# Patient Record
Sex: Female | Born: 1980
Health system: Southern US, Community
[De-identification: ages and names within clinical notes are randomized; demographics above are authoritative.]

## PROBLEM LIST (undated history)

## (undated) DIAGNOSIS — E282 Polycystic ovarian syndrome: Secondary | ICD-10-CM

## (undated) DIAGNOSIS — F32A Depression, unspecified: Secondary | ICD-10-CM

## (undated) DIAGNOSIS — F329 Major depressive disorder, single episode, unspecified: Secondary | ICD-10-CM

## (undated) DIAGNOSIS — R87629 Unspecified abnormal cytological findings in specimens from vagina: Secondary | ICD-10-CM

## (undated) DIAGNOSIS — G4719 Other hypersomnia: Secondary | ICD-10-CM

## (undated) DIAGNOSIS — B009 Herpesviral infection, unspecified: Secondary | ICD-10-CM

## (undated) DIAGNOSIS — G43909 Migraine, unspecified, not intractable, without status migrainosus: Secondary | ICD-10-CM

## (undated) DIAGNOSIS — E669 Obesity, unspecified: Secondary | ICD-10-CM

## (undated) DIAGNOSIS — F429 Obsessive-compulsive disorder, unspecified: Secondary | ICD-10-CM

## (undated) DIAGNOSIS — K589 Irritable bowel syndrome without diarrhea: Secondary | ICD-10-CM

## (undated) DIAGNOSIS — O24419 Gestational diabetes mellitus in pregnancy, unspecified control: Secondary | ICD-10-CM

## (undated) DIAGNOSIS — F419 Anxiety disorder, unspecified: Secondary | ICD-10-CM

## (undated) HISTORY — DX: Other hypersomnia: G47.19

## (undated) HISTORY — DX: Gestational diabetes mellitus in pregnancy, unspecified control: O24.419

## (undated) HISTORY — DX: Herpesviral infection, unspecified: B00.9

## (undated) HISTORY — DX: Irritable bowel syndrome, unspecified: K58.9

## (undated) HISTORY — DX: Migraine, unspecified, not intractable, without status migrainosus: G43.909

## (undated) HISTORY — DX: Obsessive-compulsive disorder, unspecified: F42.9

## (undated) HISTORY — DX: Unspecified abnormal cytological findings in specimens from vagina: R87.629

## (undated) HISTORY — PX: OTHER SURGICAL HISTORY: SHX169

## (undated) HISTORY — PX: TONSILLECTOMY: SUR1361

## (undated) HISTORY — DX: Major depressive disorder, single episode, unspecified: F32.9

## (undated) HISTORY — DX: Depression, unspecified: F32.A

## (undated) HISTORY — DX: Anxiety disorder, unspecified: F41.9

---
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
## 2004-02-18 ENCOUNTER — Ambulatory Visit: Payer: Self-pay | Admitting: Family Medicine

## 2004-06-27 ENCOUNTER — Ambulatory Visit: Payer: Self-pay | Admitting: Family Medicine

## 2004-07-08 ENCOUNTER — Ambulatory Visit: Payer: Self-pay | Admitting: Family Medicine

## 2004-12-12 ENCOUNTER — Ambulatory Visit: Payer: Self-pay | Admitting: Family Medicine

## 2005-01-08 ENCOUNTER — Ambulatory Visit: Payer: Self-pay | Admitting: Family Medicine

## 2005-03-27 ENCOUNTER — Ambulatory Visit: Payer: Self-pay | Admitting: Family Medicine

## 2005-05-25 ENCOUNTER — Ambulatory Visit: Payer: Self-pay | Admitting: Family Medicine

## 2006-02-12 ENCOUNTER — Ambulatory Visit: Payer: Self-pay | Admitting: Physician Assistant

## 2006-08-02 ENCOUNTER — Ambulatory Visit: Payer: Self-pay | Admitting: Family Medicine

## 2008-05-09 ENCOUNTER — Other Ambulatory Visit: Admission: RE | Admit: 2008-05-09 | Discharge: 2008-05-09 | Payer: Self-pay | Admitting: Obstetrics and Gynecology

## 2008-05-28 ENCOUNTER — Other Ambulatory Visit: Admission: RE | Admit: 2008-05-28 | Discharge: 2008-05-28 | Payer: Self-pay | Admitting: Obstetrics and Gynecology

## 2011-07-22 DIAGNOSIS — E559 Vitamin D deficiency, unspecified: Secondary | ICD-10-CM | POA: Insufficient documentation

## 2011-07-22 DIAGNOSIS — L309 Dermatitis, unspecified: Secondary | ICD-10-CM | POA: Insufficient documentation

## 2011-07-22 DIAGNOSIS — G43909 Migraine, unspecified, not intractable, without status migrainosus: Secondary | ICD-10-CM | POA: Insufficient documentation

## 2011-07-22 DIAGNOSIS — R519 Headache, unspecified: Secondary | ICD-10-CM | POA: Insufficient documentation

## 2012-07-01 ENCOUNTER — Encounter: Payer: Self-pay | Admitting: Certified Nurse Midwife

## 2012-07-05 ENCOUNTER — Ambulatory Visit: Payer: Self-pay | Admitting: Certified Nurse Midwife

## 2012-07-11 ENCOUNTER — Telehealth: Payer: Self-pay | Admitting: Certified Nurse Midwife

## 2012-07-11 NOTE — Telephone Encounter (Signed)
Level of progesterone may be decreasing due to getting close to her next Depo.  She can try 600mg  of Motrin every 6 hours x 24 hours to see if this will decrease or stop bleeding.  Since she has done well with Depo this is probably the issue  If bleeding is heavy and continues needs OV

## 2012-07-11 NOTE — Telephone Encounter (Signed)
Pt is bleeding while on the depo shot. Please give pt a call.

## 2012-07-11 NOTE — Telephone Encounter (Signed)
LMTCB  aa 

## 2012-07-11 NOTE — Telephone Encounter (Signed)
Spoke with pt who has been on Depo since her last AEX almost a year ago. Pt has been bleeding for 15 days. Having cramps. Changing tampon 3-4 times a day. Bleeding is not really heavy, tampon not full each time. Pt due for next depo on 07-18-12. Please advise.   aa

## 2012-07-12 NOTE — Telephone Encounter (Signed)
Spoke with pt to advise about trying motrin 600 mg every 6 hrs x 24 to try to stop bleeding. Pt is allergic to red dye on outside of pills, but she will try to find a brand that does not have the color. Pt states she has an appt for her AEX next week and is worried she may still be bleeding. Advised as long as she is not bleeding heavily, she should be fine to get her Pap. Advised pt to call back for OV if bleeding becomes heavy or does not stop. Pt agreeable.  aa

## 2012-07-18 ENCOUNTER — Other Ambulatory Visit: Payer: Self-pay

## 2012-07-18 ENCOUNTER — Encounter: Payer: Self-pay | Admitting: Certified Nurse Midwife

## 2012-07-18 ENCOUNTER — Ambulatory Visit (INDEPENDENT_AMBULATORY_CARE_PROVIDER_SITE_OTHER): Payer: BC Managed Care – PPO | Admitting: Certified Nurse Midwife

## 2012-07-18 ENCOUNTER — Ambulatory Visit: Payer: Self-pay | Admitting: Certified Nurse Midwife

## 2012-07-18 VITALS — BP 110/64 | Ht 66.75 in | Wt 237.0 lb

## 2012-07-18 DIAGNOSIS — IMO0001 Reserved for inherently not codable concepts without codable children: Secondary | ICD-10-CM

## 2012-07-18 DIAGNOSIS — Z01419 Encounter for gynecological examination (general) (routine) without abnormal findings: Secondary | ICD-10-CM

## 2012-07-18 DIAGNOSIS — Z309 Encounter for contraceptive management, unspecified: Secondary | ICD-10-CM

## 2012-07-18 DIAGNOSIS — Z0000 Encounter for general adult medical examination without abnormal findings: Secondary | ICD-10-CM

## 2012-07-18 MED ORDER — MEDROXYPROGESTERONE ACETATE 150 MG/ML IM SUSP: 150.0000 mg | INTRAMUSCULAR | Status: DC

## 2012-07-18 MED ORDER — MEDROXYPROGESTERONE ACETATE 150 MG/ML IM SUSP
150.0000 mg | Freq: Once | INTRAMUSCULAR | Status: AC
  Administered 2012-07-18: 150 mg via INTRAMUSCULAR

## 2012-07-18 NOTE — Progress Notes (Signed)
32 y.o. DivorcedCaucasian female   G0P0000 here for annual exam. Periods none from onset of Depo Provera in 3-13 until end of December 2013/1-14 had menses 7 days duration then updated Depo and due for Depo now with 16 days of menses.No menses today. Happy with choice.  Using Depo Provera due to Migraine with aura and Topamax.  .     Patient's last menstrual period was 06/26/2012.          Sexually active: yes  The current method of family planning is Depo-Provera injections.    Exercising: yes  some walking Last mammogram: none Last pap: 06-11-11 neg Last BMD: none Alcohol: 5-10 a week Tobacco: none Colonoscopy: none   Health Maintenance  Topic Date Due  . Pap Smear  10/11/2000  . Tetanus/tdap  04/07/2011  . Influenza Vaccine  12/05/2012    Family History  Problem Relation Age of Onset  . Hypertension Mother   . Hyperlipidemia Mother   . Hypertension Father   . Hyperlipidemia Father   . Hyperlipidemia Sister   . Cancer Maternal Grandmother     melanoma  . Cancer Paternal Grandmother     liver  . Diabetes Paternal Grandmother     There is no problem list on file for this patient.   Past Medical History  Diagnosis Date  . HSV-1 (herpes simplex virus 1) infection   . Anxiety     panic attacks  . Migraines   . Depression   . OCD (obsessive compulsive disorder)     Past Surgical History  Procedure Laterality Date  . Tonsillectomy    . Wisdom teeth       Allergies: Advil; Amoxicillin; and Penicillins  Current Outpatient Prescriptions  Medication Sig Dispense Refill  . Cetirizine HCl (ZYRTEC ALLERGY PO) Take by mouth as needed.       . Chlorzoxazone 750 MG TABS Take by mouth as needed.      . Eletriptan Hydrobromide (RELPAX PO) Take by mouth as needed.       . medroxyPROGESTERone (DEPO-PROVERA) 150 MG/ML injection Inject 150 mg into the muscle every 3 (three) months.      . Sertraline HCl (ZOLOFT PO) Take 200 mg by mouth daily.       . SUMAtriptan (IMITREX)  6 MG/0.5ML SOLN injection Inject 6 mg into the skin as needed for migraine or headache. F      . topiramate (TOPAMAX) 100 MG tablet Take 100 mg by mouth. Take 1 1/2 tablets daily      . traZODone (DESYREL) 50 MG tablet Take 50 mg by mouth at bedtime.      . Vitamin D, Ergocalciferol, (DRISDOL) 50000 UNITS CAPS Take 50,000 Units by mouth.       No current facility-administered medications for this visit.    ROS: Pertinent items are noted in HPI.  Exam:    BP 110/64  Ht 5' 6.75" (1.695 m)  Wt 237 lb (107.502 kg)  BMI 37.42 kg/m2  LMP 06/26/2012 Weight change: @WEIGHTCHANGE @ Last 3 height recordings:  Ht Readings from Last 3 Encounters:  07/18/12 5' 6.75" (1.695 m)   General appearance: alert, cooperative and appears stated age Head: Normocephalic, without obvious abnormality, atraumatic Neck: no adenopathy, supple, symmetrical, trachea midline and thyroid not enlarged, symmetric, no tenderness/mass/nodules Lungs: clear to auscultation bilaterally Breasts: normal appearance, no masses or tenderness, No nipple retraction or dimpling Heart: regular rate and rhythm Abdomen: soft, non-tender; bowel sounds normal; no masses,  no organomegaly Extremities: extremities normal, atraumatic,  no cyanosis or edema Skin: Skin color, texture, turgor normal. No rashes or lesions or insect bites(mosquitos) Lymph nodes: Cervical, supraclavicular, and axillary nodes normal. no inguinal nodes palpated Neurologic: Alert and oriented X 3, normal strength and tone. Normal symmetric reflexes. Normal coordination and gait   Pelvic: External genitalia:  no lesions              Urethra: normal appearing urethra with no masses, tenderness or lesions              Bartholins and Skenes: Bartholin's, Urethra, Skene's normal                 Vagina: normal appearing vagina with normal color and discharge, no lesions              Cervix: normal appearance              Pap taken: no        Bimanual Exam:  Uterus:   uterus is normal size, shape, consistency and nontender                                      Adnexa:    normal adnexa in size, nontender and no masses                                      Rectovaginal: Confirms                                      Anus:  normal sphincter tone, no lesions  A; Well woman Exam Contraception: Depo Provera desired     P:Reviewed health and wellness pertinent to exam Pap smear as indicated Rx Depo Provera see order  Discussed bleeding expectations with Depo Provera, will keep menses calendar  return annually or prn      An After Visit Summary was printed and given to the patient.  Reviewed, TL

## 2012-07-18 NOTE — Patient Instructions (Signed)

## 2012-07-21 LAB — HEMOGLOBIN, FINGERSTICK: Hemoglobin, fingerstick: 13.6 g/dL (ref 12.0–16.0)

## 2012-10-03 ENCOUNTER — Ambulatory Visit (INDEPENDENT_AMBULATORY_CARE_PROVIDER_SITE_OTHER): Payer: BC Managed Care – PPO | Admitting: Certified Nurse Midwife

## 2012-10-03 VITALS — BP 116/74 | HR 64 | Resp 12 | Ht 66.75 in

## 2012-10-03 DIAGNOSIS — Z3040 Encounter for surveillance of contraceptives, unspecified: Secondary | ICD-10-CM

## 2012-10-03 MED ORDER — MEDROXYPROGESTERONE ACETATE 150 MG/ML IM SUSP
150.0000 mg | Freq: Once | INTRAMUSCULAR | Status: AC
  Administered 2012-10-03: 150 mg via INTRAMUSCULAR

## 2012-10-03 NOTE — Progress Notes (Signed)
Depo Provera injection given; Pt. tolerated injection well.

## 2012-12-19 ENCOUNTER — Ambulatory Visit: Payer: BC Managed Care – PPO

## 2012-12-19 ENCOUNTER — Ambulatory Visit (INDEPENDENT_AMBULATORY_CARE_PROVIDER_SITE_OTHER): Payer: BC Managed Care – PPO | Admitting: Nurse Practitioner

## 2012-12-19 ENCOUNTER — Encounter: Payer: Self-pay | Admitting: Nurse Practitioner

## 2012-12-19 VITALS — BP 120/76 | HR 64 | Resp 16 | Ht 66.75 in | Wt 252.0 lb

## 2012-12-19 DIAGNOSIS — Z3040 Encounter for surveillance of contraceptives, unspecified: Secondary | ICD-10-CM

## 2012-12-19 DIAGNOSIS — B9689 Other specified bacterial agents as the cause of diseases classified elsewhere: Secondary | ICD-10-CM

## 2012-12-19 DIAGNOSIS — R399 Unspecified symptoms and signs involving the genitourinary system: Secondary | ICD-10-CM

## 2012-12-19 DIAGNOSIS — N760 Acute vaginitis: Secondary | ICD-10-CM

## 2012-12-19 DIAGNOSIS — R3989 Other symptoms and signs involving the genitourinary system: Secondary | ICD-10-CM

## 2012-12-19 DIAGNOSIS — A499 Bacterial infection, unspecified: Secondary | ICD-10-CM

## 2012-12-19 MED ORDER — METRONIDAZOLE 0.75 % VA GEL: 1.0000 | Freq: Every day | VAGINAL | Status: DC

## 2012-12-19 MED ORDER — FLUCONAZOLE 150 MG PO TABS: 150.0000 mg | ORAL_TABLET | Freq: Once | ORAL | Status: DC

## 2012-12-19 MED ORDER — MEDROXYPROGESTERONE ACETATE 150 MG/ML IM SUSP
150.0000 mg | Freq: Once | INTRAMUSCULAR | Status: AC
  Administered 2012-12-19: 150 mg via INTRAMUSCULAR

## 2012-12-19 NOTE — Progress Notes (Signed)
Subjective:     Patient ID: Lisa Howe, female   DOB: [birth year], 32 y.o.   MRN: [id number]  HPI this 32 yo WS Fe presents with symptoms of a yeast or bacterial vaginitis that started a few days ago.  She has symptoms of vaginal burning with a slight itching.  Some cloudy urine noted this am. Denies any other urinary symptoms.  She is getting married in 2 weeks and they are going to [place] for their honeymoon.  She is on Depo Provera for birth control and injection is also due today.   Review of Systems  Constitutional: Negative for fever, chills and fatigue.  HENT: Negative.   Respiratory: Negative.   Cardiovascular: Negative.   Gastrointestinal: Negative.  Negative for nausea, vomiting, abdominal pain, diarrhea and constipation.  Endocrine: Negative.   Genitourinary: Positive for vaginal discharge. Negative for dysuria, frequency, hematuria, flank pain, vaginal bleeding, vaginal pain, pelvic pain and dyspareunia.  Musculoskeletal: Negative.   Skin: Negative.   Neurological: Negative.   Psychiatric/Behavioral: Negative.        Objective:   Physical Exam  Constitutional: She is oriented to person, place, and time. She appears well-developed and well-nourished.  Pulmonary/Chest: Effort normal.  Abdominal: Soft. She exhibits no distension and no mass. There is no tenderness. There is no rebound and no guarding.  Genitourinary:  Vaginal discharge is thin grayish color. No cervicitis. No pain on bimanual. Wet prep; PH 5.0; KOH: few yeast; NSS: + clue cells. Urine is clear.  Musculoskeletal: Normal range of motion.  Neurological: She is alert and oriented to person, place, and time.  Psychiatric: She has a normal mood and affect. Her behavior is normal. Judgment and thought content normal.       Assessment:     Bacterial Vaginitis with some yeast Upcoming wedding and travel Depo Provera for birth control and injection is due today.     Plan:     Metrogel vaginal cream at  [facility] X 5 days Diflucan 150 mg X 2 doses with a refill since will be traveling Depo Provera will be given today and repeat as directed If any symptoms of persistent infection to call back

## 2012-12-19 NOTE — Patient Instructions (Addendum)
Bacterial Vaginosis Bacterial vaginosis (BV) is a vaginal infection where the normal balance of bacteria in the vagina is disrupted. The normal balance is then replaced by an overgrowth of certain bacteria. There are several different kinds of bacteria that can cause BV. BV is the most common vaginal infection in women of childbearing age. CAUSES   The cause of BV is not fully understood. BV develops when there is an increase or imbalance of harmful bacteria.  Some activities or behaviors can upset the normal balance of bacteria in the vagina and put women at increased risk including:  Having a new sex partner or multiple sex partners.  Douching.  Using an intrauterine device (IUD) for contraception.  It is not clear what role sexual activity plays in the development of BV. However, women that have never had sexual intercourse are rarely infected with BV. Women do not get BV from toilet seats, bedding, swimming pools or from touching objects around them.  SYMPTOMS   Grey vaginal discharge.  A fish-like odor with discharge, especially after sexual intercourse.  Itching or burning of the vagina and vulva.  Burning or pain with urination.  Some women have no signs or symptoms at all. DIAGNOSIS  Your caregiver must examine the vagina for signs of BV. Your caregiver will perform lab tests and look at the sample of vaginal fluid through a microscope. They will look for bacteria and abnormal cells (clue cells), a pH test higher than 4.5, and a positive amine test all associated with BV.  RISKS AND COMPLICATIONS   Pelvic inflammatory disease (PID).  Infections following gynecology surgery.  Developing HIV.  Developing herpes virus. TREATMENT  Sometimes BV will clear up without treatment. However, all women with symptoms of BV should be treated to avoid complications, especially if gynecology surgery is planned. Female partners generally do not need to be treated. However, BV may spread  between female sex partners so treatment is helpful in preventing a recurrence of BV.   BV may be treated with antibiotics. The antibiotics come in either pill or vaginal cream forms. Either can be used with nonpregnant or pregnant women, but the recommended dosages differ. These antibiotics are not harmful to the baby.  BV can recur after treatment. If this happens, a second round of antibiotics will often be prescribed.  Treatment is important for pregnant women. If not treated, BV can cause a premature delivery, especially for a pregnant woman who had a premature birth in the past. All pregnant women who have symptoms of BV should be checked and treated.  For chronic reoccurrence of BV, treatment with a type of prescribed gel vaginally twice a week is helpful. HOME CARE INSTRUCTIONS   Finish all medication as directed by your caregiver.  Do not have sex until treatment is completed.  Tell your sexual partner that you have a vaginal infection. They should see their caregiver and be treated if they have problems, such as a mild rash or itching.  Practice safe sex. Use condoms. Only have 1 sex partner. PREVENTION  Basic prevention steps can help reduce the risk of upsetting the natural balance of bacteria in the vagina and developing BV:  Do not have sexual intercourse (be abstinent).  Do not douche.  Use all of the medicine prescribed for treatment of BV, even if the signs and symptoms go away.  Tell your sex partner if you have BV. That way, they can be treated, if needed, to prevent reoccurrence. SEEK MEDICAL CARE IF:     Your symptoms are not improving after 3 days of treatment.  You have increased discharge, pain, or fever. MAKE SURE YOU:   Understand these instructions.  Will watch your condition.  Will get help right away if you are not doing well or get worse. FOR MORE INFORMATION  Division of STD Prevention (DSTDP), Centers for Disease Control and Prevention:  www.cdc.gov/std American [employer] Health Association (ASHA): www.ashastd.org  Document Released: 03/23/2005 Document Revised: 06/15/2011 Document Reviewed: 09/13/2008 Exit[employer] Patient Information 2014 [employer].  

## 2012-12-27 NOTE — Progress Notes (Signed)
Encounter reviewed by Dr. Brook Silva.  

## 2013-02-23 ENCOUNTER — Ambulatory Visit
Admission: RE | Admit: 2013-02-23 | Discharge: 2013-02-23 | Disposition: A | Discharge status: Home / Self Care | Payer: BC Managed Care – PPO | Source: Ambulatory Visit | Attending: Nurse Practitioner | Admitting: Nurse Practitioner

## 2013-02-23 ENCOUNTER — Other Ambulatory Visit: Payer: Self-pay | Admitting: Nurse Practitioner

## 2013-02-23 DIAGNOSIS — K5900 Constipation, unspecified: Secondary | ICD-10-CM

## 2013-03-07 ENCOUNTER — Ambulatory Visit: Payer: BC Managed Care – PPO

## 2013-03-10 ENCOUNTER — Encounter: Payer: Self-pay | Admitting: *Deleted

## 2013-03-10 ENCOUNTER — Ambulatory Visit (INDEPENDENT_AMBULATORY_CARE_PROVIDER_SITE_OTHER): Payer: BC Managed Care – PPO | Admitting: *Deleted

## 2013-03-10 VITALS — BP 118/76 | HR 96 | Ht 66.75 in | Wt 256.0 lb

## 2013-03-10 DIAGNOSIS — Z3040 Encounter for surveillance of contraceptives, unspecified: Secondary | ICD-10-CM

## 2013-03-10 MED ORDER — MEDROXYPROGESTERONE ACETATE 150 MG/ML IM SUSP
150.0000 mg | Freq: Once | INTRAMUSCULAR | Status: AC
  Administered 2013-03-10: 150 mg via INTRAMUSCULAR

## 2013-03-10 NOTE — Progress Notes (Signed)
Patient ID: Lisa Howe, female   DOB: 1980/12/23, 32 y.o.   MRN: [id number] Pt arrived for Depo Provera injection.  Pt tolerated injection well in right gluteal. Last AEX - 07/18/12 Last Depo Provera Given - 12/19/12 Pt is within due dates. Pt should return between 05/26/13 and 06/09/13.

## 2013-05-10 ENCOUNTER — Telehealth: Payer: Self-pay | Admitting: Certified Nurse Midwife

## 2013-05-10 NOTE — Telephone Encounter (Signed)
Patient calling to cancel depo provera shot. She states she wants to start preparing her body for pregnancy but is not in a hurry. Patient wants to speak with nurse about this.

## 2013-05-10 NOTE — Telephone Encounter (Signed)
agree

## 2013-05-10 NOTE — Telephone Encounter (Signed)
Call to patient who reports that she canceled Depo appointment since has decided she wants to begin getting ready for pregnancy. She knows that it can take several months for Depo to get out of her system.  She is scheduled for AEX with Debbi on 07-21-13 and advised that she and Debbi can discuss this further at that time.  Advised to track cycles on calendar to see when they begin getting regular again, she is taking MVI. She is also on migraine meds and plans to discontinue these for 3 months prior to attempting pregnancy.  Routing to provider for final review. Patient agreeable to disposition. Will close encounter

## 2013-05-30 ENCOUNTER — Ambulatory Visit: Payer: BC Managed Care – PPO

## 2013-07-21 ENCOUNTER — Ambulatory Visit (INDEPENDENT_AMBULATORY_CARE_PROVIDER_SITE_OTHER): Payer: BC Managed Care – PPO | Admitting: Certified Nurse Midwife

## 2013-07-21 ENCOUNTER — Encounter: Payer: Self-pay | Admitting: Certified Nurse Midwife

## 2013-07-21 VITALS — BP 118/68 | HR 68 | Resp 16 | Ht 66.5 in | Wt 250.0 lb

## 2013-07-21 DIAGNOSIS — Z01419 Encounter for gynecological examination (general) (routine) without abnormal findings: Secondary | ICD-10-CM

## 2013-07-21 DIAGNOSIS — Z8669 Personal history of other diseases of the nervous system and sense organs: Secondary | ICD-10-CM | POA: Insufficient documentation

## 2013-07-21 DIAGNOSIS — E7800 Pure hypercholesterolemia, unspecified: Secondary | ICD-10-CM | POA: Insufficient documentation

## 2013-07-21 DIAGNOSIS — Z0000 Encounter for general adult medical examination without abnormal findings: Secondary | ICD-10-CM

## 2013-07-21 DIAGNOSIS — N912 Amenorrhea, unspecified: Secondary | ICD-10-CM

## 2013-07-21 LAB — POCT URINALYSIS DIPSTICK
Bilirubin, UA: NEGATIVE
Blood, UA: NEGATIVE
Glucose, UA: NEGATIVE
Ketones, UA: NEGATIVE
Leukocytes, UA: NEGATIVE
Nitrite, UA: NEGATIVE
Protein, UA: NEGATIVE
Urobilinogen, UA: NEGATIVE
pH, UA: 5

## 2013-07-21 LAB — POCT URINE PREGNANCY: Preg Test, Ur: NEGATIVE

## 2013-07-21 NOTE — Progress Notes (Signed)
33 y.o. G0P0000 Divorced Caucasian Fe here for annual exam. Periods none with Depo Provera. Has stopped Depo now and using condoms until period returns and plans to try for pregnancy once off all medication. Working on weight loss with PCP. Last Hgb A1c slightly elevated at PCP. Aware of need to be as healthy as possible to try for pregnancy and will continue to work on this. No other health issues today.  Patient's last menstrual period was 06/26/2012.          Sexually active: yes  The current method of family planning is condoms all the time.    Exercising: no  exercise Smoker:  no  Health Maintenance: Pap:  06-11-11 ASCUS -HPV follow up Pap negative MMG:  none Colonoscopy:  none BMD:   none TDaP:  Within 10 years Labs: Poct urine-neg, Upt-neg Self breast exam: done monthly   reports that she has quit smoking. Her smoking use included Cigarettes. She smoked 0.00 packs per day. She does not have any smokeless tobacco history on file. She reports that she drinks alcohol. She reports that she does not use illicit drugs.  Past Medical History  Diagnosis Date  . HSV-1 (herpes simplex virus 1) infection   . Anxiety     panic attacks  . Migraines   . Depression   . OCD (obsessive compulsive disorder)   . IBS (irritable bowel syndrome)     not sure what caused it  . Excessive daytime sleepiness     Past Surgical History  Procedure Laterality Date  . Tonsillectomy    . Wisdom teeth       Current Outpatient Prescriptions  Medication Sig Dispense Refill  . Ascorbic Acid (VITAMIN C PO) Take 1,000 mg by mouth 2 (two) times daily.      . baclofen (LIORESAL) 10 MG tablet Take 10 mg by mouth as needed for muscle spasms.      . Cetirizine HCl (ZYRTEC ALLERGY PO) Take by mouth daily.       . Chlorzoxazone (LORZONE) 750 MG TABS Take by mouth as needed.      Mariane Baumgarten Calcium (STOOL SOFTENER PO) Take by mouth. 2 at bedtime      . Eletriptan Hydrobromide (RELPAX PO) Take by mouth as needed.        Marland Kitchen LINZESS 290 MCG CAPS capsule daily.      . Multiple Vitamins-Minerals (MULTIVITAMIN PO) Take by mouth daily.      . Probiotic Product (PROBIOTIC DAILY PO) Take by mouth daily.      . simvastatin (ZOCOR) 20 MG tablet daily.      . Topiramate ER (TROKENDI XR) 100 MG CP24 Take by mouth. Take 3 capsules once daily      . UNABLE TO FIND as needed. Lidocaine swish used per md as nasal spray      . Vitamin D, Ergocalciferol, (DRISDOL) 50000 UNITS CAPS Take 50,000 Units by mouth every 7 (seven) days.       . medroxyPROGESTERone (DEPO-PROVERA) 150 MG/ML injection Inject 1 mL (150 mg total) into the muscle every 3 (three) months.  1 mL  3  . SUMAtriptan (IMITREX) 6 MG/0.5ML SOLN injection Inject 6 mg into the skin as needed for migraine or headache. F       No current facility-administered medications for this visit.    Family History  Problem Relation Age of Onset  . Hypertension Mother   . Hyperlipidemia Mother   . Hypertension Father   . Hyperlipidemia Father   .  Hyperlipidemia Sister   . Cancer Maternal Grandmother     melanoma  . Cancer Paternal Grandmother     liver  . Diabetes Paternal Grandmother   . Parkinson's disease Maternal Grandfather     ROS:  Pertinent items are noted in HPI.  Otherwise, a comprehensive ROS was negative.  Exam:   BP 118/68  Pulse 68  Resp 16  Ht 5' 6.5" (1.689 m)  Wt 250 lb (113.399 kg)  BMI 39.75 kg/m2  LMP 06/26/2012 Height: 5' 6.5" (168.9 cm)  Ht Readings from Last 3 Encounters:  07/21/13 5' 6.5" (1.689 m)  03/10/13 5' 6.75" (1.695 m)  12/19/12 5' 6.75" (1.695 m)    General appearance: alert, cooperative and appears stated age Head: Normocephalic, without obvious abnormality, atraumatic Neck: no adenopathy, supple, symmetrical, trachea midline and thyroid normal to inspection and palpation and non-palpable Lungs: clear to auscultation bilaterally Breasts: normal appearance, no masses or tenderness, No nipple retraction or dimpling, No  nipple discharge or bleeding, No axillary or supraclavicular adenopathy Heart: regular rate and rhythm Abdomen: soft, non-tender; no masses,  no organomegaly Extremities: extremities normal, atraumatic, no cyanosis or edema Skin: Skin color, texture, turgor normal. No rashes or lesions Lymph nodes: Cervical, supraclavicular, and axillary nodes normal. No abnormal inguinal nodes palpated Neurologic: Grossly normal   Pelvic: External genitalia:  no lesions              Urethra:  normal appearing urethra with no masses, tenderness or lesions              Bartholin's and Skene's: normal                 Vagina: normal appearing vagina with normal color and discharge, no lesions              Cervix: normal, non tender              Pap taken: yes Bimanual Exam:  Uterus:  normal size, contour, position, consistency, mobility, non-tender and anteverted              Adnexa: normal adnexa and no mass, fullness, tenderness               Rectovaginal: Confirms               Anus:  normal sphincter tone, no lesions  A:  Well Woman with normal exam  Contraception condoms  Planning for pregnancy in future once weaned off all medication  Elevated cholesterol with stable medication with PCP management  History of severe migraine headaches with stable medication use with MD  History abnormal pap smear CIN 1  P:   Reviewed health and wellness pertinent to exam  Stressed importance of use until ready for pregnancy and medication is stopped if possible. Stay on multivitamin with folic acid. Continue to work on weight control and exercise.  Continue follow up with PCP as indicated  [id number] screen  Pap smear as per guidelines   pap smear taken today with HPVHR if negative repeat one year if not per results  counseled on breast self exam, adequate intake of calcium and vitamin D, diet and exercise  return annually or prn  An After Visit Summary was printed and given to the patient.

## 2013-07-21 NOTE — Patient Instructions (Signed)
General topics  Next pap or exam is  due in 1 year Take a Women's multivitamin Take 1200 mg. of calcium daily - prefer dietary If any concerns in interim to call back  Breast Self-Awareness Practicing breast self-awareness may pick up problems early, prevent significant medical complications, and possibly save your life. By practicing breast self-awareness, you can become familiar with how your breasts look and feel and if your breasts are changing. This allows you to notice changes early. It can also offer you some reassurance that your breast health is good. One way to learn what is normal for your breasts and whether your breasts are changing is to do a breast self-exam. If you find a lump or something that was not present in the past, it is best to contact your caregiver right away. Other findings that should be evaluated by your caregiver include nipple discharge, especially if it is bloody; skin changes or reddening; areas where the skin seems to be pulled in (retracted); or new lumps and bumps. Breast pain is seldom associated with cancer (malignancy), but should also be evaluated by a caregiver. BREAST SELF-EXAM The best time to examine your breasts is 5 7 days after your menstrual period is over.  ExitCare Patient Information 2013 [employer].   Exercise to Stay Healthy Exercise helps you become and stay healthy. EXERCISE IDEAS AND TIPS Choose exercises that:  You enjoy.  Fit into your day. You do not need to exercise really hard to be healthy. You can do exercises at a slow or medium level and stay healthy. You can:  Stretch before and after working out.  Try yoga, Pilates, or tai chi.  Lift weights.  Walk fast, swim, jog, run, climb stairs, bicycle, dance, or rollerskate.  Take aerobic classes. Exercises that burn about 150 calories:  Running 1  miles in 15 minutes.  Playing volleyball for 45 to 60 minutes.  Washing and waxing a car for 45 to 60  minutes.  Playing touch football for 45 minutes.  Walking 1  miles in 35 minutes.  Pushing a stroller 1  miles in 30 minutes.  Playing basketball for 30 minutes.  Raking leaves for 30 minutes.  Bicycling 5 miles in 30 minutes.  Walking 2 miles in 30 minutes.  Dancing for 30 minutes.  Shoveling snow for 15 minutes.  Swimming laps for 20 minutes.  Walking up stairs for 15 minutes.  Bicycling 4 miles in 15 minutes.  Gardening for 30 to 45 minutes.  Jumping rope for 15 minutes.  Washing windows or floors for 45 to 60 minutes. Document Released: 04/25/2010 Document Revised: 06/15/2011 Document Reviewed: 04/25/2010 [employer] Patient Information 2013 [employer].   Other topics ( that may be useful information):    Sexually Transmitted Disease Sexually transmitted disease (STD) refers to any infection that is passed from person to person during sexual activity. This may happen by way of saliva, semen, blood, vaginal mucus, or urine. Common STDs include:  Gonorrhea.  Chlamydia.  Syphilis.  HIV/AIDS.  Genital herpes.  Hepatitis B and C.  Trichomonas.  Human papillomavirus (HPV).  Pubic lice. CAUSES  An STD may be spread by bacteria, virus, or parasite. A person can get an STD by:  Sexual intercourse with an infected person.  Sharing sex toys with an infected person.  Sharing needles with an infected person.  Having intimate contact with the genitals, mouth, or rectal areas of an infected person. SYMPTOMS  Some people may not have any symptoms, but   they can still pass the infection to others. Different STDs have different symptoms. Symptoms include:  Painful or bloody urination.  Pain in the pelvis, abdomen, vagina, anus, throat, or eyes.  Skin rash, itching, irritation, growths, or sores (lesions). These usually occur in the genital or anal area.  Abnormal vaginal discharge.  Penile discharge in men.  Soft, flesh-colored skin growths in the  genital or anal area.  Fever.  Pain or bleeding during sexual intercourse.  Swollen glands in the groin area.  Yellow skin and eyes (jaundice). This is seen with hepatitis. DIAGNOSIS  To make a diagnosis, your caregiver may:  Take a medical history.  Perform a physical exam.  Take a specimen (culture) to be examined.  Examine a sample of discharge under a microscope.  Perform blood test TREATMENT   Chlamydia, gonorrhea, trichomonas, and syphilis can be cured with antibiotic medicine.  Genital herpes, hepatitis, and HIV can be treated, but not cured, with prescribed medicines. The medicines will lessen the symptoms.  Genital warts from HPV can be treated with medicine or by freezing, burning (electrocautery), or surgery. Warts may come back.  HPV is a virus and cannot be cured with medicine or surgery.However, abnormal areas may be followed very closely by your caregiver and may be removed from the cervix, vagina, or vulva through office procedures or surgery. If your diagnosis is confirmed, your recent sexual partners need treatment. This is true even if they are symptom-free or have a negative culture or evaluation. They should not have sex until their caregiver says it is okay. HOME CARE INSTRUCTIONS  All sexual partners should be informed, tested, and treated for all STDs.  Take your antibiotics as directed. Finish them even if you start to feel better.  Only take over-the-counter or prescription medicines for pain, discomfort, or fever as directed by your caregiver.  Rest.  Eat a balanced diet and drink enough fluids to keep your urine clear or pale yellow.  Do not have sex until treatment is completed and you have followed up with your caregiver. STDs should be checked after treatment.  Keep all follow-up appointments, Pap tests, and blood tests as directed by your caregiver.  Only use latex condoms and water-soluble lubricants during sexual activity. Do not use  petroleum jelly or oils.  Avoid alcohol and illegal drugs.  Get vaccinated for HPV and hepatitis. If you have not received these vaccines in the past, talk to your caregiver about whether one or both might be right for you.  Avoid risky sex practices that can break the skin. The only way to avoid getting an STD is to avoid all sexual activity.Latex condoms and dental dams (for oral sex) will help lessen the risk of getting an STD, but will not completely eliminate the risk. SEEK MEDICAL CARE IF:   You have a fever.  You have any new or worsening symptoms. Document Released: 06/13/2002 Document Revised: 06/15/2011 Document Reviewed: 06/20/2010 [facility] Patient Information 2013 [place].    Domestic Abuse You are being battered or abused if someone close to you hits, pushes, or physically hurts you in any way. You also are being abused if you are forced into activities. You are being sexually abused if you are forced to have sexual contact of any kind. You are being emotionally abused if you are made to feel worthless or if you are constantly threatened. It is important to remember that help is available. No one has the right to abuse you. PREVENTION OF FURTHER  ABUSE  Learn the warning signs of danger. This varies with situations but may include: the use of alcohol, threats, isolation from friends and family, or forced sexual contact. Leave if you feel that violence is going to occur.  If you are attacked or beaten, report it to the police so the abuse is documented. You do not have to press charges. The police can protect you while you or the attackers are leaving. Get the officer's name and badge number and a copy of the report.  Find someone you can trust and tell them what is happening to you: your caregiver, a nurse, clergy member, close friend or family member. Feeling ashamed is natural, but remember that you have done nothing wrong. No one deserves abuse. Document Released:  03/20/2000 Document Revised: 06/15/2011 Document Reviewed: 05/29/2010 [facility] Patient Information 2013 [place].    How Much is Too Much Alcohol? Drinking too much alcohol can cause injury, accidents, and health problems. These types of problems can include:   Car crashes.  Falls.  Family fighting (domestic violence).  Drowning.  Fights.  Injuries.  Burns.  Damage to certain organs.  Having a baby with birth defects. ONE DRINK CAN BE TOO MUCH WHEN YOU ARE:  Working.  Pregnant or breastfeeding.  Taking medicines. Ask your doctor.  Driving or planning to drive. If you or someone you know has a drinking problem, get help from a doctor.  Document Released: 01/17/2009 Document Revised: 06/15/2011 Document Reviewed: 01/17/2009 [facility] Patient Information 2013 [place].   Smoking Hazards Smoking cigarettes is extremely bad for your health. Tobacco smoke has over 200 known poisons in it. There are over 60 chemicals in tobacco smoke that cause cancer. Some of the chemicals found in cigarette smoke include:   Cyanide.  Benzene.  Formaldehyde.  Methanol (wood alcohol).  Acetylene (fuel used in welding torches).  Ammonia. Cigarette smoke also contains the poisonous gases nitrogen oxide and carbon monoxide.  Cigarette smokers have an increased risk of many serious medical problems and Smoking causes approximately:  90% of all lung cancer deaths in men.  80% of all lung cancer deaths in women.  90% of deaths from chronic obstructive lung disease. Compared with nonsmokers, smoking increases the risk of:  Coronary heart disease by 2 to 4 times.  Stroke by 2 to 4 times.  Men developing lung cancer by 23 times.  Women developing lung cancer by 13 times.  Dying from chronic obstructive lung diseases by 12 times.  . Smoking is the most preventable cause of death and disease in our society.  WHY IS SMOKING ADDICTIVE?  Nicotine is the chemical  agent in tobacco that is capable of causing addiction or dependence.  When you smoke and inhale, nicotine is absorbed rapidly into the bloodstream through your lungs. Nicotine absorbed through the lungs is capable of creating a powerful addiction. Both inhaled and non-inhaled nicotine may be addictive.  Addiction studies of cigarettes and spit tobacco show that addiction to nicotine occurs mainly during the teen years, when young people begin using tobacco products. WHAT ARE THE BENEFITS OF QUITTING?  There are many health benefits to quitting smoking.   Likelihood of developing cancer and heart disease decreases. Health improvements are seen almost immediately.  Blood pressure, pulse rate, and breathing patterns start returning to normal soon after quitting. QUITTING SMOKING   American Lung Association - 1-800-LUNGUSA  American Cancer Society - 1-800-ACS-2345 Document Released: 04/30/2004 Document Revised: 06/15/2011 Document Reviewed: 01/02/2009 [facility] Patient Information 2013 [place].   Stress Management Stress is a state of physical or mental tension that often results from changes in your life or normal routine. Some common causes of stress are:  Death of a loved one.  Injuries or severe illnesses.  Getting fired or changing jobs.  Moving into a new home. Other causes may be:  Sexual problems.  Business or financial losses.  Taking on a large debt.  Regular conflict with someone at home or at work.  Constant tiredness from lack of sleep. It is not just bad things that are stressful. It may be stressful to:  Win the lottery.  Get married.  Buy a new car. The amount of stress that can be easily tolerated varies from person to person. Changes generally cause stress, regardless of the types of change. Too much stress can affect your health. It may lead to physical or emotional problems. Too little stress (boredom) may also become stressful. SUGGESTIONS TO  REDUCE STRESS:  Talk things over with your family and friends. It often is helpful to share your concerns and worries. If you feel your problem is serious, you may want to get help from a professional counselor.  Consider your problems one at a time instead of lumping them all together. Trying to take care of everything at once may seem impossible. List all the things you need to do and then start with the most important one. Set a goal to accomplish 2 or 3 things each day. If you expect to do too many in a single day you will naturally fail, causing you to feel even more stressed.  Do not use alcohol or drugs to relieve stress. Although you may feel better for a short time, they do not remove the problems that caused the stress. They can also be habit forming.  Exercise regularly - at least 3 times per week. Physical exercise can help to relieve that "uptight" feeling and will relax you.  The shortest distance between despair and hope is often a good night's sleep.  Go to bed and get up on time allowing yourself time for appointments without being rushed.  Take a short "time-out" period from any stressful situation that occurs during the day. Close your eyes and take some deep breaths. Starting with the muscles in your face, tense them, hold it for a few seconds, then relax. Repeat this with the muscles in your neck, shoulders, hand, stomach, back and legs.  Take good care of yourself. Eat a balanced diet and get plenty of rest.  Schedule time for having fun. Take a break from your daily routine to relax. HOME CARE INSTRUCTIONS   Call if you feel overwhelmed by your problems and feel you can no longer manage them on your own.  Return immediately if you feel like hurting yourself or someone else. Document Released: 09/16/2000 Document Revised: 06/15/2011 Document Reviewed: 05/09/2007 [employer] Patient Information 2013 [employer].  

## 2013-07-22 LAB — RUBELLA SCREEN: Rubella: 6.21 Index — ABNORMAL HIGH (ref <0.90)

## 2013-07-26 LAB — IPS PAP TEST WITH HPV

## 2013-07-28 NOTE — Progress Notes (Signed)
Reviewed personally.  M. Suzanne Alvester Eads, MD.  

## 2013-09-06 ENCOUNTER — Ambulatory Visit: Payer: BC Managed Care – PPO | Admitting: Podiatry

## 2013-09-11 ENCOUNTER — Ambulatory Visit (INDEPENDENT_AMBULATORY_CARE_PROVIDER_SITE_OTHER): Payer: BC Managed Care – PPO | Admitting: Podiatry

## 2013-09-11 ENCOUNTER — Encounter: Payer: Self-pay | Admitting: Podiatry

## 2013-09-11 VITALS — BP 112/71 | HR 82 | Resp 18

## 2013-09-11 DIAGNOSIS — B079 Viral wart, unspecified: Secondary | ICD-10-CM

## 2013-09-11 MED ORDER — HYDROCODONE-ACETAMINOPHEN 5-325 MG PO TABS: 1.0000 | ORAL_TABLET | ORAL | Status: DC | PRN

## 2013-09-11 NOTE — Progress Notes (Signed)
   Subjective:    Patient ID: Lisa Howe, female    DOB: 03/07/1981, 33 y.o.   MRN: [id number]  HPI I have some places on the ball of my right foot and have been going on 2 years and burns and swells and throbs and walking long distance hurts and the right bunion area hurts some and the 5th toe on the right as well.  Patient describes no treatment including self treatment for either of the above complaints the    Review of Systems  Constitutional: Positive for activity change.  All other systems reviewed and are negative.      Objective:   Physical Exam Orientated x3 white female presents with her husband  Vascular: DP and PT pulses 2/4 bilaterally  Neurological: Ankle reflexes reactive bilaterally  Dermatological: 6 punctate keratoses plantar second MPJ on the right foot with one area having some hemorrhagic debris Diffuse plantar keratoses sub-fifth MPJ bilaterally  Musculoskeletal: Taylor's bunions deformity bilaterally Palpable tenderness lateral fifth right MPJ       Assessment & Plan:   Assessment: Verrucae plantaris x6 right foot Taylor's bunion deformity bilaterally  Plan: Canthacur applied to 6 lesions on the plantar right foot. Patient advised to keep. Bandage dry x24 hours. Remove and its blister formation apply topical antibiotic. If painful Rx hydrocodone 5/325 #12  Patient was advised she has Taylor's bunion deformity and recommended soft wide shoes as initial treatment.  Reappoint x7 days

## 2013-09-11 NOTE — Patient Instructions (Signed)
Keep bandage dry x24 hours If blistered apply topical antibiotic ointment If it painful fill prescription for hydrocodone

## 2013-09-12 ENCOUNTER — Encounter: Payer: Self-pay | Admitting: Podiatry

## 2013-09-20 ENCOUNTER — Encounter: Payer: Self-pay | Admitting: Podiatry

## 2013-09-20 ENCOUNTER — Ambulatory Visit (INDEPENDENT_AMBULATORY_CARE_PROVIDER_SITE_OTHER): Payer: BC Managed Care – PPO | Admitting: Podiatry

## 2013-09-20 VITALS — BP 115/72 | HR 80 | Resp 12

## 2013-09-20 DIAGNOSIS — B079 Viral wart, unspecified: Secondary | ICD-10-CM

## 2013-09-20 NOTE — Progress Notes (Signed)
Patient ID: Lisa Howe, female   DOB: 1980/04/14, 33 y.o.   MRN: [id number]  Subjective: This patient presents after Canthacur the #1. She describes no blistering reaction, however, sensitivity in the area  Objective: Plantar second third MPJ as 6 punctate circumscribed keratoses without any obvious hemorrhagic area within these lesions.  Assessment: Verruca plantaris versus keratoses  Plan: Areas were debrided Cantacur treatment #2. Patient was advised to keep wound site dry x24 hours. If blisters form apply topical antibiotic ointment If painful use existing pain medication.  Reappoint x7 days

## 2013-12-20 ENCOUNTER — Telehealth: Payer: Self-pay | Admitting: Certified Nurse Midwife

## 2013-12-20 NOTE — Telephone Encounter (Signed)
Spoke with patient. Patient states that she was previously on the shot for birth control and last one she did not take was in February. Patient has not had a cycle since and wants to try to get pregnant. "We are still using condoms because I am concerned." Advised patient will need to get her in to see Regina Eck CNM for evaluation. Patient is agreeable. Requests appointment next Thursday as she is off. Appointment scheduled for 9/24 at 9:15am with Regina Eck CNM. Agreeable to date and time.  Routing to provider for final review. Patient agreeable to disposition. Will close encounter

## 2013-12-20 NOTE — Telephone Encounter (Signed)
Left message to call Graceyn Fodor at [phone]. 

## 2013-12-20 NOTE — Telephone Encounter (Signed)
Pt calling with questions for nurse because she stopped her birth control in November.She has been spotting and trying to get pregnant.

## 2013-12-28 ENCOUNTER — Ambulatory Visit (INDEPENDENT_AMBULATORY_CARE_PROVIDER_SITE_OTHER): Payer: BC Managed Care – PPO | Admitting: Certified Nurse Midwife

## 2013-12-28 ENCOUNTER — Encounter: Payer: Self-pay | Admitting: Certified Nurse Midwife

## 2013-12-28 VITALS — BP 110/80 | HR 72 | Ht 66.5 in | Wt 255.0 lb

## 2013-12-28 DIAGNOSIS — N912 Amenorrhea, unspecified: Secondary | ICD-10-CM

## 2013-12-28 DIAGNOSIS — Z0189 Encounter for other specified special examinations: Secondary | ICD-10-CM

## 2013-12-28 LAB — POCT URINE PREGNANCY: Preg Test, Ur: NEGATIVE

## 2013-12-28 NOTE — Progress Notes (Signed)
  33 y.o.Married Caucasian female presents with no menses in ? Past 5 years due to contraceptive use. Last Depo was due in 2-15, she did not renew, Last Depo was 11/15. Patient now ready to try for pregnancy. Has been working with PCP regarding medication. Now off Toprimate and Zocor and Linzess and Imitrex. Only on Multivitamin and Mineral supplement only. No Migraine occurrence in the past 3-4 months. Intensity has decreased. Patient has been working on weight loss with weight watchers and exercise. Patient has been working with MD regarding medication management if needed for migraines, but since they have decreased none needed. Has lost 10 pounds and continues to work a good healthy diet. Patient has noted cramping that felt like a period would start in the past 2 days, but no bleeding yet. No other health issues today.   O: Healthy WDWN overweight female Affect: normal  Thyroid:not enlarged, no nodules Pelvic exam: External genital normal no lesions Vagina: normal appearance, no lesions Cervix: normal, non tender no lesions Uterus: normal, mid position Adnexa: normal, non tender, no masses  Assessment:  Amenorrhea  Secondary to Depo Provera use Planning Pregnancy Contraception: Consistent Condom use with spermicide Normal pelvic exam  Plan: Discussed with patient factors that may be contributory to amenorrhea, such as Depo Provera use, which she is aware, weight, thyroid change and pituitary change. Encouraged to continue weight loss program and exercise to prepare for pregnancy. Discussed lab evaluation for above and patient agreeable. If labs area normal will draw HCG qualitative in 2 weeks and proceed with Provera challenge, unless patient has spontaneous period. Patient voiced understanding of plan and will continue consistent contraceptive use. Reassured normal pelvic exam. Questions addressed at length.  Labs:FSH,TSH,Prolactin, ABO RH  Rv 2 weeks for lab

## 2013-12-28 NOTE — Patient Instructions (Signed)
Prenatal Care  WHAT IS PRENATAL CARE?  Prenatal care means health care during your pregnancy, before your baby is born. It is very important to take care of yourself and your baby during your pregnancy by:   Getting early prenatal care. If you know you are pregnant, or think you might be pregnant, call your health care provider as soon as possible. Schedule a visit for a prenatal exam.  Getting regular prenatal care. Follow your health care provider's schedule for blood and other necessary tests. Do not miss appointments.  Doing everything you can to keep yourself and your baby healthy during your pregnancy.  Getting complete care. Prenatal care should include evaluation of the medical, dietary, educational, psychological, and social needs of you and your significant other. The medical and genetic history of your family and the family of your baby's father should be discussed with your health care provider.  Discussing with your health care provider:  Prescription, over-the-counter, and herbal medicines that you take.  Any history of substance abuse, alcohol use, smoking, and illegal drug use.  Any history of domestic abuse and violence.  Immunizations you have received.  Your nutrition and diet.  The amount of exercise you do.  Any environmental and occupational hazards to which you are exposed.  History of sexually transmitted infections for both you and your partner.  Previous pregnancies you have had. WHY IS PRENATAL CARE SO IMPORTANT?  By regularly seeing your health care provider, you help ensure that problems can be identified early so that they can be treated as soon as possible. Other problems might be prevented. Many studies have shown that early and regular prenatal care is important for the health of mothers and their babies.  HOW CAN I TAKE CARE OF MYSELF WHILE I AM PREGNANT?  Here are ways to take care of yourself and your baby:   Start or continue taking your  multivitamin with 400 micrograms (mcg) of folic acid every day.  Get early and regular prenatal care. It is very important to see a health care provider during your pregnancy. Your health care provider will check at each visit to make sure that you and your baby are healthy. If there are any problems, action can be taken right away to help you and your baby.  Eat a healthy diet that includes:  Fruits.  Vegetables.  Foods low in saturated fat.  Whole grains.  Calcium-rich foods, such as milk, yogurt, and hard cheeses.  Drink 6-8 glasses of liquids a day.  Unless your health care provider tells you not to, try to be physically active for 30 minutes, most days of the week. If you are pressed for time, you can get your activity in through 10-minute segments, three times a day.  Do not smoke, drink alcohol, or use drugs. These can cause long-term damage to your baby. Talk with your health care provider about steps to take to stop smoking. Talk with a member of your faith community, a counselor, a trusted friend, or your health care provider if you are concerned about your alcohol or drug use.  Ask your health care provider before taking any medicine, even over-the-counter medicines. Some medicines are not safe to take during pregnancy.  Get plenty of rest and sleep.  Avoid hot tubs and saunas during pregnancy.  Do not have X-rays taken unless absolutely necessary and with the recommendation of your health care provider. A lead shield can be placed on your abdomen to protect your baby when   X-rays are taken in other parts of your body.  Do not empty the cat litter when you are pregnant. It may contain a parasite that causes an infection called toxoplasmosis, which can cause birth defects. Also, use gloves when working in garden areas used by cats.  Do not eat uncooked or undercooked meats or fish.  Do not eat soft, mold-ripened cheeses (Brie, Camembert, and chevre) or soft, blue-veined  cheese (Danish blue and Roquefort).  Stay away from toxic chemicals like:  Insecticides.  Solvents (some cleaners or paint thinners).  Lead.  Mercury.  Sexual intercourse may continue until the end of the pregnancy, unless you have a medical problem or there is a problem with the pregnancy and your health care provider tells you not to.  Do not wear high-heel shoes, especially during the second half of the pregnancy. You can lose your balance and fall.  Do not take long trips, unless absolutely necessary. Be sure to see your health care provider before going on the trip.  Do not sit in one position for more than 2 hours when on a trip.  Take a copy of your medical records when going on a trip. Know where a hospital is located in the city you are visiting, in case of an emergency.  Most dangerous household products will have pregnancy warnings on their labels. Ask your health care provider about products if you are unsure.  Limit or eliminate your caffeine intake from coffee, tea, sodas, medicines, and chocolate.  Many women continue working through pregnancy. Staying active might help you stay healthier. If you have a question about the safety or the hours you work at your particular job, talk with your health care provider.  Get informed:  Read books.  Watch videos.  Go to childbirth classes for you and your significant other.  Talk with experienced moms.  Ask your health care provider about childbirth education classes for you and your partner. Classes can help you and your partner prepare for the birth of your baby.  Ask about a baby doctor (pediatrician) and methods and pain medicine for labor, delivery, and possible cesarean delivery. HOW OFTEN SHOULD I SEE MY HEALTH CARE PROVIDER DURING PREGNANCY?  Your health care provider will give you a schedule for your prenatal visits. You will have visits more often as you get closer to the end of your pregnancy. An average  pregnancy lasts about 40 weeks.  A typical schedule includes visiting your health care provider:   About once each month during your first 6 months of pregnancy.  Every 2 weeks during the next 2 months.  Weekly in the last month, until the delivery date. Your health care provider will probably want to see you more often if:  You are older than 35 years.  Your pregnancy is high risk because you have certain health problems or problems with the pregnancy, such as:  Diabetes.  High blood pressure.  The baby is not growing on schedule, according to the dates of the pregnancy. Your health care provider will do special tests to make sure you and your baby are not having any serious problems. WHAT HAPPENS DURING PRENATAL VISITS?   At your first prenatal visit, your health care provider will do a physical exam and talk to you about your health history and the health history of your partner and your family. Your health care provider will be able to tell you what date to expect your baby to be born on.  Your   first physical exam will include checks of your blood pressure, measurements of your height and weight, and an exam of your pelvic organs. Your health care provider will do a Pap test if you have not had one recently and will do cultures of your cervix to make sure there is no infection.  At each prenatal visit, there will be tests of your blood, urine, blood pressure, weight, and the progress of the baby will be checked.  At your later prenatal visits, your health care provider will check how you are doing and how your baby is developing. You may have a number of tests done as your pregnancy progresses.  Ultrasound exams are often used to check on your baby's growth and health.  You may have more urine and blood tests, as well as special tests, if needed. These may include amniocentesis to examine fluid in the pregnancy sac, stress tests to check how the baby responds to contractions, or a  biophysical profile to measure your baby's well-being. Your health care provider will explain the tests and why they are necessary.  You should be tested for high blood sugar (gestational diabetes) between the 24th and 28th weeks of your pregnancy.  You should discuss with your health care provider your plans to breastfeed or bottle-feed your baby.  Each visit is also a chance for you to learn about staying healthy during pregnancy and to ask questions. Document Released: 03/26/2003 Document Revised: 03/28/2013 Document Reviewed: 06/07/2013 ExitCare Patient Information 2015 [employer]. This information is not intended to replace advice given to you by your health care provider. Make sure you discuss any questions you have with your health care provider.  

## 2013-12-29 ENCOUNTER — Encounter: Payer: Self-pay | Admitting: Certified Nurse Midwife

## 2013-12-29 LAB — ABO AND RH: Rh Type: POSITIVE

## 2013-12-29 LAB — FOLLICLE STIMULATING HORMONE: FSH: 6 m[IU]/mL

## 2013-12-29 LAB — TSH: TSH: 2.153 u[IU]/mL (ref 0.350–4.500)

## 2013-12-29 LAB — PROLACTIN: Prolactin: 6.9 ng/mL

## 2013-12-29 NOTE — Progress Notes (Signed)
Reviewed personally.  M. Suzanne Nolan Lasser, MD.  

## 2014-01-10 ENCOUNTER — Other Ambulatory Visit (INDEPENDENT_AMBULATORY_CARE_PROVIDER_SITE_OTHER): Payer: BC Managed Care – PPO

## 2014-01-10 DIAGNOSIS — N912 Amenorrhea, unspecified: Secondary | ICD-10-CM

## 2014-01-11 ENCOUNTER — Other Ambulatory Visit: Payer: BC Managed Care – PPO

## 2014-01-11 ENCOUNTER — Telehealth: Payer: Self-pay

## 2014-01-11 LAB — HCG, SERUM, QUALITATIVE: Preg, Serum: NEGATIVE

## 2014-01-11 MED ORDER — MEDROXYPROGESTERONE ACETATE 10 MG PO TABS: 10.0000 mg | ORAL_TABLET | Freq: Every day | ORAL | Status: DC

## 2014-01-11 NOTE — Telephone Encounter (Signed)
[email or web address]

## 2014-01-11 NOTE — Telephone Encounter (Signed)
Patient notified of results. See lab 

## 2014-01-11 NOTE — Telephone Encounter (Signed)
Pt called joy back during lunch

## 2014-01-11 NOTE — Telephone Encounter (Signed)
Message copied by Susy Manor on Thu Jan 11, 2014 12:56 PM ------      Message from: Regina Eck      Created: Thu Jan 11, 2014 10:10 AM       Notify patient that serum pregnancy test negative.      Will now need Provera Rx 10 mg for 10 days order in      Instruct  patient to call when she has bleeding or if no bleeding up two weeks of completion of Provera ------

## 2014-01-11 NOTE — Telephone Encounter (Signed)
Left message to call back  

## 2014-01-22 ENCOUNTER — Telehealth: Payer: Self-pay | Admitting: Certified Nurse Midwife

## 2014-01-22 NOTE — Telephone Encounter (Signed)
Spoke with patient. Patient states that since Friday she has been experiencing "extreme burning when I urinate. I tried OTC Monistat and it was excruciating." Patient has increased urinary frequency with decreased output. Denies back pain or discharge. Has been drinking cranberry juice and taking probiotics. Denies fevers. "There is a slight odor but I think it may be from trying to treat myself." Advised patient will need to come in for evaluation with Regina Eck CNM. Patient is agreeable. Offered today at 11:15am or 2:30pm but patient declines due to work schedule. Appointment scheduled for tomorrow at 12:45pm with Regina Eck CNM. Patient is agreeable and verbalizes understanding,  Routing to provider for final review. Patient agreeable to disposition. Will close encounter

## 2014-01-22 NOTE — Telephone Encounter (Signed)
Spoke with patient. Advised of message as seen below from [place]. Patient is agreeable and states that she did this over the weekend. "It felt better until I went to the restroom after and it was still painful." Advised patient that Regina Eck CNM recommends this until can be seen tomorrow for appointment. Patient is agreeable.   Routing to provider for final review. Patient agreeable to disposition. Will close encounter

## 2014-01-22 NOTE — Telephone Encounter (Signed)
Patient is experiencing some side effects from Provera.

## 2014-01-22 NOTE — Telephone Encounter (Signed)
She needs to try Aveeno sitz bath for symptoms until then

## 2014-01-23 ENCOUNTER — Ambulatory Visit (INDEPENDENT_AMBULATORY_CARE_PROVIDER_SITE_OTHER): Payer: BC Managed Care – PPO | Admitting: Certified Nurse Midwife

## 2014-01-23 ENCOUNTER — Encounter: Payer: Self-pay | Admitting: Certified Nurse Midwife

## 2014-01-23 VITALS — BP 118/74 | HR 72 | Temp 98.3°F | Ht 66.5 in | Wt 256.0 lb

## 2014-01-23 DIAGNOSIS — B373 Candidiasis of vulva and vagina: Secondary | ICD-10-CM

## 2014-01-23 DIAGNOSIS — R350 Frequency of micturition: Secondary | ICD-10-CM

## 2014-01-23 DIAGNOSIS — B3731 Acute candidiasis of vulva and vagina: Secondary | ICD-10-CM

## 2014-01-23 DIAGNOSIS — B379 Candidiasis, unspecified: Secondary | ICD-10-CM

## 2014-01-23 LAB — POCT URINALYSIS DIPSTICK
Bilirubin, UA: NEGATIVE
Blood, UA: NEGATIVE
Glucose, UA: NEGATIVE
Ketones, UA: NEGATIVE
Nitrite, UA: NEGATIVE
Protein, UA: NEGATIVE
Urobilinogen, UA: NEGATIVE
pH, UA: 5

## 2014-01-23 MED ORDER — NYSTATIN-TRIAMCINOLONE 100000-0.1 UNIT/GM-% EX CREA: 1.0000 "application " | TOPICAL_CREAM | Freq: Two times a day (BID) | CUTANEOUS | Status: DC

## 2014-01-23 MED ORDER — FLUCONAZOLE 150 MG PO TABS: 150.0000 mg | ORAL_TABLET | Freq: Once | ORAL | Status: DC

## 2014-01-23 NOTE — Progress Notes (Signed)
33 y.o. married g0p0 here with complaint of vaginal symptoms of itching, burning, and increase discharge. Describes discharge as white, slightly thick. Treated self with Monistat 3 and had burning with use. Symptoms now primarily on external vulva area. No lesions or blisters or history of HSV.Marland Kitchen Onset of symptoms 4 days ago. Denies new personal products or vaginal dryness. No STD concerns. Urinary symptoms frequency only .   O:Healthy female WDWN Affect: normal, orientation x 3  Exam:Skin warm and dry Abdomen:soft, negative suuprapubic Lymph node: no enlargement or tenderness Pelvic exam: External genital:slightly swollen with increase pink and slight exudate wet prep taken BUS: negative Vagina: white/beige non odorous discharge noted. Ph: 4.0  ,Wet prep taken Cervix: normal, non tender Uterus: normal, non tender Adnexa:normal, non tender, no masses or fullness noted   Wet Prep results: positive for yeast   A:Yeast vaginitis/vulvitis Urinary frequency R/O UTI   P:Discussed findings of yeast vaginitis/vulvitis and etiology. Discussed Aveeno or baking soda sitz bath for comfort. Avoid moist clothes for extended period of time. If working out in gym clothes or  for long periods of time change underwear  if possible. Rx: Diflucan see order with instructions Rx Mycolog 2 cream see order with instructions Lab Urine culture. Increase water intake. Discussed frequency probably result of yeast irritation.  Rv prn

## 2014-01-23 NOTE — Patient Instructions (Signed)

## 2014-01-24 LAB — URINE CULTURE
Colony Count: NO GROWTH
Organism ID, Bacteria: NO GROWTH

## 2014-01-26 NOTE — Progress Notes (Signed)
Reviewed personally.  M. Suzanne Sibbie Flammia, MD.  

## 2014-02-05 ENCOUNTER — Telehealth: Payer: Self-pay | Admitting: Certified Nurse Midwife

## 2014-02-05 NOTE — Telephone Encounter (Signed)
Spoke with patient. Patient states that she took the last pill of the Provera 2 weeks ago yesterday and has not had any bleeding. Patient was seen on 12/28/13 with Regina Eck CNM. Patient was previously using Depo for contraception. Last depo was 11/15. Please see OV notes from 12/28/13. Advised patient would send a message over to provider and return call with further recommendations and instructions. Patient is agreeable.  Routing to [employer], FNP as covering Cc; Regina Eck CNM

## 2014-02-05 NOTE — Telephone Encounter (Signed)
Patient calling to say she still has not started her menstrual cycle since taking Provera.

## 2014-02-07 NOTE — Telephone Encounter (Signed)
Spoke with patient. Advised patient that I spoke with Regina Eck CNM and she is waiting on further recommendations from [place]. Advised Dr.Miller is out of the office today but will be back first thing tomorrow morning and we will be in contact with her regarding next steps. Patient is agreeable and verbalizes understanding.

## 2014-02-07 NOTE — Telephone Encounter (Signed)
Spoke with patient. Patient is calling to check on status of call. Advised patient will print phone call and speak with Regina Eck CNM and return call with further recommendations and instructions. Patient is agreeable.

## 2014-02-08 ENCOUNTER — Other Ambulatory Visit: Payer: Self-pay | Admitting: Certified Nurse Midwife

## 2014-02-08 DIAGNOSIS — N912 Amenorrhea, unspecified: Secondary | ICD-10-CM

## 2014-02-08 NOTE — Telephone Encounter (Signed)
PR: $465.38

## 2014-02-08 NOTE — Telephone Encounter (Signed)
Spoke with Dr Sabra Heck about plan for [facility]. Please call her and let her know we need to schedule PUS to evaluate, for possible hormone use if needed. Order placed. Please also let her know that she will be called with insurance information and scheduled

## 2014-02-08 NOTE — Telephone Encounter (Signed)
Spoke with patient. Advised patient of message as seen below from Regina Eck CNM. Patient is agreeable and would like to schedule at this time. Appointment scheduled for 11/12 at 1:30pm with 2pm consult with Dr.Miller. Patient is agreeable to date and time. Advised patient awaiting precert for ultrasound and she will be contacted with OOP cost if it will be anything more than a copay for her visit. Patient is agreeable.  Cc: Felipa Emory for precert  Routing to provider for final review. Patient agreeable to disposition. Will close encounter

## 2014-02-08 NOTE — Telephone Encounter (Signed)
Spoke with patient. Advised that per benefit quote received, she will be responsible to pay $465.38 when she comes in for PUS. Patient agreeable.

## 2014-02-08 NOTE — Addendum Note (Signed)
Addended by: Michele Mcalpine on: 02/08/2014 01:23 PM   Modules accepted: Orders

## 2014-02-15 ENCOUNTER — Ambulatory Visit (INDEPENDENT_AMBULATORY_CARE_PROVIDER_SITE_OTHER): Payer: BC Managed Care – PPO

## 2014-02-15 ENCOUNTER — Ambulatory Visit (INDEPENDENT_AMBULATORY_CARE_PROVIDER_SITE_OTHER): Payer: BC Managed Care – PPO | Admitting: Obstetrics & Gynecology

## 2014-02-15 VITALS — BP 106/78 | Wt 258.0 lb

## 2014-02-15 DIAGNOSIS — N912 Amenorrhea, unspecified: Secondary | ICD-10-CM

## 2014-02-15 DIAGNOSIS — D251 Intramural leiomyoma of uterus: Secondary | ICD-10-CM

## 2014-02-15 NOTE — Progress Notes (Signed)
33 y.o. G0 Married W female here for a pelvic ultrasound due to amenorrhea.  Pt has used Depo Provera for contraception in the past.  She is right at one year from her last Depo Provera injection.  Pt has experienced prolonged amenorrhea since cessation of the Depo Provera.  FSH, TSH, and prolactin were all normal in September.  Provera, 10mg  x 10 days, was given without any response other than cramping.  Pt here for additional recommendations.    Pt and spouse are non smokers.  Also, no pets.  She is up to date with flu vaccine and Tdap.    Last LMP was greater than 5 years ago.  Sexually active:  yes  Contraception: no method  FINDINGS: UTERUS: 7.0 x 4.3 x 3.2cm with small intramural fibroid 11 x 32mm EMS: 5.59mm ADNEXA:   Left ovary 3.3 x 1.6 x 2.0.  Peripheral small follicles noted bilaterally on both ovaries.  All follicles are less than 5 mm.   Right ovary 3.2 x 1.8 x 2.0cm CUL DE SAC: no free fluid  D/W pt findings.  Pt has amenorrhea due to either prolonged results of Depo Provera or due to hypothalamic amenorrhea.  FSH/LH will be drawn today.  Depending on these results, pt will either immediately start Femara 7.5mg  for 5 days follow by ultrasound on day 10 OR will start using estrogen in pt.  Femara, side effects and risks discussed, including twin and triplet rate.  Ovarian hyperstimulation discussed.  Possible need for REI specialist at some point d/w pt as well.  All questions answered.    Assessment:  Amenorrhea, possibly due to Depo Provera Small intramural fibroid Many small follicles, not meeting diagnostic criteria for PCOS  Plan: FSH/LH today  ~25 minutes spent with patient >50% of time was in face to face discussion of above.

## 2014-02-16 ENCOUNTER — Encounter: Payer: Self-pay | Admitting: Obstetrics & Gynecology

## 2014-02-16 LAB — FSH/LH
FSH: 6.3 m[IU]/mL
LH: 6.4 m[IU]/mL

## 2014-02-16 MED ORDER — LETROZOLE 2.5 MG PO TABS: ORAL_TABLET | ORAL | Status: DC

## 2014-02-16 NOTE — Addendum Note (Signed)
Addended by: Megan Salon on: 02/16/2014 06:23 PM   Modules accepted: Orders

## 2014-02-19 ENCOUNTER — Telehealth: Payer: Self-pay | Admitting: Obstetrics & Gynecology

## 2014-02-19 DIAGNOSIS — N912 Amenorrhea, unspecified: Secondary | ICD-10-CM

## 2014-02-19 NOTE — Telephone Encounter (Signed)
Pt says she is calling to schedule ultrasound with Dr Sabra Heck.

## 2014-02-19 NOTE — Telephone Encounter (Signed)
Patient had PUS 11.12.2015. There is no order for a follow up PUS.  Gay Filler, Does patient need another PUS?  Thank you, Gabriel Cirri

## 2014-02-19 NOTE — Telephone Encounter (Signed)
Patient said she talked to Dr Sabra Heck on Friday about scheduling another ultrasound.

## 2014-02-21 NOTE — Telephone Encounter (Signed)
Dr.Miller please review and advice based on lab results from 02/15/14. OV note seen below with recommendations based on results.  D/W pt findings. Pt has amenorrhea due to either prolonged results of Depo Provera or due to hypothalamic amenorrhea. FSH/LH will be drawn today. Depending on these results, pt will either immediately start Femara 7.5mg  for 5 days follow by ultrasound on day 10 OR will start using estrogen in pt. Femara, side effects and risks discussed, including twin and triplet rate. Ovarian hyperstimulation discussed. Possible need for REI specialist at some point d/w pt as well. All questions answered.

## 2014-02-21 NOTE — Telephone Encounter (Signed)
Spoke with patient. PUS scheduled for 11/24 at 1pm with 1:30pm consult with Dr.Miller. Patient is agreeable to date and time. Order placed for PUS will need precert.  CC: Felipa Emory for [employer] to provider for final review. Patient agreeable to disposition. Will close encounter

## 2014-02-21 NOTE — Telephone Encounter (Signed)
Yes.  U/S needed on 41/42 to check follicular development.  Thanks.

## 2014-02-21 NOTE — Telephone Encounter (Signed)
Patient calling to check on the status of her call. She is anxious to schedule so she can arrange coverage at work.

## 2014-02-22 NOTE — Telephone Encounter (Signed)
Pre-cert complete/pr $28//ZMO

## 2014-02-27 ENCOUNTER — Ambulatory Visit (INDEPENDENT_AMBULATORY_CARE_PROVIDER_SITE_OTHER): Payer: BC Managed Care – PPO

## 2014-02-27 ENCOUNTER — Ambulatory Visit (INDEPENDENT_AMBULATORY_CARE_PROVIDER_SITE_OTHER): Payer: BC Managed Care – PPO | Admitting: Obstetrics & Gynecology

## 2014-02-27 VITALS — BP 122/82 | Ht 66.5 in | Wt 259.0 lb

## 2014-02-27 DIAGNOSIS — N912 Amenorrhea, unspecified: Secondary | ICD-10-CM

## 2014-02-27 DIAGNOSIS — N970 Female infertility associated with anovulation: Secondary | ICD-10-CM

## 2014-02-27 NOTE — Progress Notes (Signed)
33 y.o. Lisa Howe here for a pelvic ultrasound due to prolonged amenorrhea that is most likely Depo Provera related.  Pt on femara this month and here for follicular assessment.  No side effects on medication.   No LMP recorded.  Sexually active:  yes  Contraception: no method  FINDINGS: UTERUS: 7.2 x 3.8 c 3.1cm EMS: 6.31mm, appears trilaminar ADNEXA:   Left ovary 4.2 x 2.4 x 2.7cm with 79mm, 34VQQ, and 59DG follicles   Right ovary 3.7 x 2.2 x 2.5 cm with 15 and 8 mm follicles CUL DE SAC: no free fluid  Reviewed findings.  Appropriate ovarian response to Femara is noted.  Pt will return for Day 21 progesterone on Dec 3.  Expect cycle to start around Dec 10th or 11th.  If no bleeding and not pregnant, will refer.  If no pregnancy this month will continue for at least 2-3 more before referral.  All questions answered.  Pt comfortable with plan.  Assessment:  Prolonged amenorrhea most likely from Depo Provera Plan: Day 21 progesterone on Dec 3.  Expect cycle around 12/10 or 12/11.  ~15 minutes spent with patient >50% of time was in face to face discussion of above.

## 2014-03-08 ENCOUNTER — Other Ambulatory Visit (INDEPENDENT_AMBULATORY_CARE_PROVIDER_SITE_OTHER): Payer: BC Managed Care – PPO

## 2014-03-08 DIAGNOSIS — N970 Female infertility associated with anovulation: Secondary | ICD-10-CM

## 2014-03-09 LAB — PROGESTERONE: Progesterone: 12.4 ng/mL

## 2014-03-15 ENCOUNTER — Encounter: Payer: Self-pay | Admitting: Obstetrics & Gynecology

## 2014-03-15 DIAGNOSIS — N912 Amenorrhea, unspecified: Secondary | ICD-10-CM | POA: Insufficient documentation

## 2014-03-15 DIAGNOSIS — N970 Female infertility associated with anovulation: Secondary | ICD-10-CM | POA: Insufficient documentation

## 2014-03-20 ENCOUNTER — Telehealth: Payer: Self-pay | Admitting: Obstetrics & Gynecology

## 2014-03-20 DIAGNOSIS — Z0189 Encounter for other specified special examinations: Secondary | ICD-10-CM

## 2014-03-20 NOTE — Telephone Encounter (Signed)
Spoke with patient. Patient states that she started cycle on 12/13. Patient was last seen on 03/08/14 for PUS evaluation after starting Femara. Per OV note if patient started cycle would continue with Femara for another 2-3 months before placing referral. Advised patient would send a message over to Dr.Miller to provide update and return call with further recommendations and instructions. Patient is agreeable.  Dr.Miller, okay to place order for Femara 2.5 mg TID for five days for patient at this time?

## 2014-03-20 NOTE — Telephone Encounter (Signed)
Patient was told to call when her cycle started. Patient was last seen 03/08/14.

## 2014-03-21 MED ORDER — LETROZOLE 2.5 MG PO TABS: ORAL_TABLET | ORAL | Status: DC

## 2014-03-21 NOTE — Telephone Encounter (Signed)
Please make sure pt know it is not TID.  She should take the 7.5mg  (all three of the 2.5mg  tabs) together at the same time.  Three tabs today for five days straight.  Thanks.

## 2014-03-21 NOTE — Telephone Encounter (Signed)
Left message to call [place] at [phone].   Patient will need to take Femara 2.5 mg TID from December 17-21 with day 23 progesterone on 1/4.

## 2014-03-21 NOTE — Telephone Encounter (Signed)
Spoke with patient. Advised patient of message as seen below from [place]. Patient is agreeable. Lab appointment scheduled for day 23 progesterone on 04/09/14 at 3pm. Patient is agreeable to date and time. Prescription for Femara 2.5mg  TID #15 [id number] sent to pharmacy on file. Patient is agreeable. Order placed for progesterone level.  Routing to provider for final review. Patient agreeable to disposition. Will close encounter

## 2014-03-21 NOTE — Telephone Encounter (Signed)
Dosage is 7.5 mg (so needs three 2.5mg  tablets) days 5-9 of cycle.  Rx should be for 15 tablets.  Also, would repeat day 23 progesterone level.  Will need order for this.  Thanks.

## 2014-03-22 NOTE — Telephone Encounter (Signed)
Spoke with patient. Instructions given.  She will pick up rx today.

## 2014-04-09 ENCOUNTER — Other Ambulatory Visit (INDEPENDENT_AMBULATORY_CARE_PROVIDER_SITE_OTHER): Payer: BC Managed Care – PPO

## 2014-04-09 DIAGNOSIS — Z0000 Encounter for general adult medical examination without abnormal findings: Secondary | ICD-10-CM

## 2014-04-09 DIAGNOSIS — Z0189 Encounter for other specified special examinations: Secondary | ICD-10-CM

## 2014-04-10 ENCOUNTER — Telehealth: Payer: Self-pay

## 2014-04-10 LAB — PROGESTERONE: Progesterone: 23.6 ng/mL

## 2014-04-10 NOTE — Telephone Encounter (Signed)
Spoke with patient. Advised patient of results as seen below from [place]. Patient is agreeable.  Routing to provider for final review. Patient agreeable to disposition. Will close encounter

## 2014-04-10 NOTE — Telephone Encounter (Signed)
-----   Message from Lyman Speller, MD sent at 04/10/2014 10:52 AM EST ----- Please call.  Progesterone level definitely shows ovulation.  Take UPT if cycle is late or call if cycle starts.  This looks good.

## 2014-04-19 ENCOUNTER — Telehealth: Payer: Self-pay | Admitting: Obstetrics & Gynecology

## 2014-04-19 NOTE — Telephone Encounter (Signed)
Yes.  Should start on Day 5 which is 1/17 and take for 5 days.  Will be Femara 7.5mg  (2.5mg  x 3 for 5 days).  Once RX done and pt aware, ok to close encounter.

## 2014-04-19 NOTE — Telephone Encounter (Signed)
Patient LMP was on 12/13 took Femara 7.5mg  12/17-12/21. Had day 23 progesterone level on 1/4 which was 23.6. Patient now started cycle on 04/18/14. Would you like her to start Femara 7.5mg  from 1/17-1/21 and return call with cycle/take UPT if cycle is late?

## 2014-04-19 NOTE — Telephone Encounter (Signed)
Patient calling to report she started her menstrual cycle 04/18/14. She says she was told to call with a report due to the medicine she is taking.

## 2014-04-20 MED ORDER — LETROZOLE 2.5 MG PO TABS: ORAL_TABLET | ORAL | Status: DC

## 2014-04-20 NOTE — Telephone Encounter (Signed)
Spoke with patient. Advised patient of message as seen below from [place]. Patient is agreeable and verbalizes understanding. Femara 2.5 mg TID #15 0RF sent to [facility] on file. Patient is agreeable.   Will close encounter.

## 2014-05-21 ENCOUNTER — Telehealth: Payer: Self-pay | Admitting: Obstetrics & Gynecology

## 2014-05-21 DIAGNOSIS — Z319 Encounter for procreative management, unspecified: Secondary | ICD-10-CM

## 2014-05-21 MED ORDER — LETROZOLE 2.5 MG PO TABS: ORAL_TABLET | ORAL | Status: DC

## 2014-05-21 NOTE — Telephone Encounter (Addendum)
Routing to Dr. Sabra Heck for review and order for Femara if needed. Order pended.

## 2014-05-21 NOTE — Telephone Encounter (Signed)
Rx signed.  She takes days 5-9 so should be able to get it to start tomorrow.  Please let pt know it is at pharmacy.  Thanks.

## 2014-05-21 NOTE — Telephone Encounter (Signed)
Pt says her cycle started Friday and was told to let Dr Sabra Heck know.

## 2014-05-21 NOTE — Telephone Encounter (Signed)
Spoke with patient and advised order sent to pharmacy. She will start tomorrow.   Patient has been using Femara monthly since November and would like to know from Dr. Sabra Heck how long she will need to continue or what next steps should be.

## 2014-05-24 NOTE — Telephone Encounter (Signed)
Typical to take Femara for 3-6 months before referral to specialist.  It will take a few weeks for new pt appt so I think in next month or so, should go ahead with referral to Dr. Kerin Perna.  Is she ok with this?

## 2014-05-24 NOTE — Telephone Encounter (Signed)
Spoke with patient. She is agreeable to plan. She will continue with Femara as directed. She would like to go ahead and plan for appointment and cancel if necessary. Referral entered for Dr. Kerin Perna. When contacted, patient will schedule for appointment in Mid-April.   Routing to provider for final review. Patient agreeable to disposition. Will close encounter

## 2014-06-01 ENCOUNTER — Telehealth: Payer: Self-pay | Admitting: Obstetrics & Gynecology

## 2014-06-01 NOTE — Telephone Encounter (Signed)
Dr Kerin Perna office calling to let Dr. Sabra Heck know that an appointment has been scheduled for 07/23/14 @3pm .

## 2014-06-01 NOTE — Telephone Encounter (Signed)
Routing to Dr.Miller as FYI.  Routing to provider for final review. Patient agreeable to disposition. Will close encounter.  

## 2014-06-18 ENCOUNTER — Telehealth: Payer: Self-pay | Admitting: Obstetrics & Gynecology

## 2014-06-18 NOTE — Telephone Encounter (Signed)
Patient started cycle on 06/17/2014. Patient has been taking Femara 2.5mg  three tablets (7.5mg  total) days 5-9 of cycle. Patient is scheduled with Dr.Yalcinkaya on 07/23/2014 at [place], would you like patient to continue with Femara at this time until seeing Yalcinkaya?

## 2014-06-18 NOTE — Telephone Encounter (Signed)
Pt wanted Dr Sabra Heck to know she started her period on last night.

## 2014-06-20 MED ORDER — LETROZOLE 2.5 MG PO TABS: ORAL_TABLET | ORAL | Status: DC

## 2014-06-20 NOTE — Telephone Encounter (Signed)
Patient is asking if she needs to continue with medication?

## 2014-06-20 NOTE — Telephone Encounter (Signed)
OK to continue with Femara as previously prescribed.  I will approve one month.  That will give her treatment until she sees Dr. Kerin Perna. Please sent to her pharmacy of choice.

## 2014-06-20 NOTE — Telephone Encounter (Signed)
Routing to Dr.Silva for review and advise. 

## 2014-06-20 NOTE — Telephone Encounter (Signed)
Spoke with patient. Advised of message as seen below from Justice. Patient is agreeable and verbalizes understanding. Rx for Femara 2.5mg  take three tablets daily (7.5mg  total) days 5-9 of cycle sent to [facility] on file. Patient is agreeable.  Routing to provider for final review. Patient agreeable to disposition. Will close encounter

## 2014-07-20 ENCOUNTER — Telehealth: Payer: Self-pay | Admitting: Certified Nurse Midwife

## 2014-07-20 NOTE — Telephone Encounter (Signed)
Message left to return call to Kaston Faughn at [phone].    

## 2014-07-20 NOTE — Telephone Encounter (Signed)
Pt has appointment on Monday [facility] and would like to know if she should still keep her aex here on Wednesday?

## 2014-07-25 ENCOUNTER — Ambulatory Visit: Payer: BC Managed Care – PPO | Admitting: Certified Nurse Midwife

## 2014-07-25 NOTE — Telephone Encounter (Signed)
Patient canceled her aex appointment today, via answering machine 07/24/14. Patient had an appointment with Dr.Yalcinkya and was told to delay her aex until after her next cycle. Patient will call after her cycle to reschedule.

## 2015-02-01 ENCOUNTER — Emergency Department (HOSPITAL_COMMUNITY)
Admission: EM | Admit: 2015-02-01 | Discharge: 2015-02-01 | Disposition: A | Discharge status: Home / Self Care | Payer: BLUE CROSS/BLUE SHIELD | Attending: Emergency Medicine | Admitting: Emergency Medicine

## 2015-02-01 ENCOUNTER — Encounter (HOSPITAL_COMMUNITY): Payer: Self-pay | Admitting: *Deleted

## 2015-02-01 DIAGNOSIS — Z8619 Personal history of other infectious and parasitic diseases: Secondary | ICD-10-CM | POA: Insufficient documentation

## 2015-02-01 DIAGNOSIS — Z8659 Personal history of other mental and behavioral disorders: Secondary | ICD-10-CM | POA: Insufficient documentation

## 2015-02-01 DIAGNOSIS — S3993XA Unspecified injury of pelvis, initial encounter: Secondary | ICD-10-CM | POA: Insufficient documentation

## 2015-02-01 DIAGNOSIS — O9A211 Injury, poisoning and certain other consequences of external causes complicating pregnancy, first trimester: Secondary | ICD-10-CM | POA: Insufficient documentation

## 2015-02-01 DIAGNOSIS — Y998 Other external cause status: Secondary | ICD-10-CM | POA: Insufficient documentation

## 2015-02-01 DIAGNOSIS — Y92410 Unspecified street and highway as the place of occurrence of the external cause: Secondary | ICD-10-CM | POA: Insufficient documentation

## 2015-02-01 DIAGNOSIS — Z8719 Personal history of other diseases of the digestive system: Secondary | ICD-10-CM | POA: Insufficient documentation

## 2015-02-01 DIAGNOSIS — Z880 Allergy status to penicillin: Secondary | ICD-10-CM | POA: Insufficient documentation

## 2015-02-01 DIAGNOSIS — G43909 Migraine, unspecified, not intractable, without status migrainosus: Secondary | ICD-10-CM | POA: Insufficient documentation

## 2015-02-01 DIAGNOSIS — T07XXXA Unspecified multiple injuries, initial encounter: Secondary | ICD-10-CM

## 2015-02-01 DIAGNOSIS — Y9389 Activity, other specified: Secondary | ICD-10-CM | POA: Insufficient documentation

## 2015-02-01 DIAGNOSIS — Z3A01 Less than 8 weeks gestation of pregnancy: Secondary | ICD-10-CM | POA: Insufficient documentation

## 2015-02-01 DIAGNOSIS — O99351 Diseases of the nervous system complicating pregnancy, first trimester: Secondary | ICD-10-CM | POA: Insufficient documentation

## 2015-02-01 DIAGNOSIS — Z8639 Personal history of other endocrine, nutritional and metabolic disease: Secondary | ICD-10-CM | POA: Insufficient documentation

## 2015-02-01 DIAGNOSIS — Z87891 Personal history of nicotine dependence: Secondary | ICD-10-CM | POA: Insufficient documentation

## 2015-02-01 DIAGNOSIS — S4991XA Unspecified injury of right shoulder and upper arm, initial encounter: Secondary | ICD-10-CM | POA: Insufficient documentation

## 2015-02-01 DIAGNOSIS — T148 Other injury of unspecified body region: Secondary | ICD-10-CM | POA: Insufficient documentation

## 2015-02-01 HISTORY — DX: Polycystic ovarian syndrome: E28.2

## 2015-02-01 NOTE — Discharge Instructions (Signed)
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
## 2015-03-25 LAB — OB RESULTS CONSOLE RUBELLA ANTIBODY, IGM: Rubella: IMMUNE

## 2015-03-25 LAB — OB RESULTS CONSOLE GC/CHLAMYDIA
Chlamydia: NEGATIVE
Gonorrhea: NEGATIVE

## 2015-03-25 LAB — OB RESULTS CONSOLE RPR: RPR: NONREACTIVE

## 2015-03-25 LAB — OB RESULTS CONSOLE HIV ANTIBODY (ROUTINE TESTING): HIV: NONREACTIVE

## 2015-03-25 LAB — OB RESULTS CONSOLE HEPATITIS B SURFACE ANTIGEN: Hepatitis B Surface Ag: NEGATIVE

## 2015-04-07 NOTE — L&D Delivery Note (Signed)
SVD of VFI at 1101 on 09/21/15.  APGARs 8,9.  Placenta to L&D.  EBL 250cc. Head delivered ROA and body followed atraumatically.  Baby to abdomen.  Cord was clamped and cut.  Cord blood was obtained.  Placenta delivered S/I/3VC.  Fundus was firmed with pitocin and massage.  2nd degree perineal and bilateral periurethral lacs were repaired with 3-0 Rapide in the normal fashion.  Mom and baby stable.  Linda Hedges, DO

## 2015-07-24 DIAGNOSIS — Z36 Encounter for antenatal screening of mother: Secondary | ICD-10-CM | POA: Diagnosis not present

## 2015-08-07 DIAGNOSIS — D509 Iron deficiency anemia, unspecified: Secondary | ICD-10-CM | POA: Diagnosis not present

## 2015-09-03 LAB — OB RESULTS CONSOLE GBS: GBS: POSITIVE

## 2015-09-04 DIAGNOSIS — D509 Iron deficiency anemia, unspecified: Secondary | ICD-10-CM | POA: Diagnosis not present

## 2015-09-13 DIAGNOSIS — Z3A38 38 weeks gestation of pregnancy: Secondary | ICD-10-CM | POA: Diagnosis not present

## 2015-09-13 DIAGNOSIS — O3663X0 Maternal care for excessive fetal growth, third trimester, not applicable or unspecified: Secondary | ICD-10-CM | POA: Diagnosis not present

## 2015-09-20 ENCOUNTER — Inpatient Hospital Stay (HOSPITAL_COMMUNITY)
Admission: AD | Admit: 2015-09-20 | Discharge: 2015-09-23 | DRG: 774 | Disposition: A | Discharge status: Home / Self Care | Payer: BLUE CROSS/BLUE SHIELD | Source: Ambulatory Visit | Attending: Obstetrics & Gynecology | Admitting: Obstetrics & Gynecology

## 2015-09-20 DIAGNOSIS — Z3A39 39 weeks gestation of pregnancy: Secondary | ICD-10-CM

## 2015-09-20 DIAGNOSIS — O9962 Diseases of the digestive system complicating childbirth: Secondary | ICD-10-CM | POA: Diagnosis present

## 2015-09-20 DIAGNOSIS — O99344 Other mental disorders complicating childbirth: Secondary | ICD-10-CM | POA: Diagnosis present

## 2015-09-20 DIAGNOSIS — Z820 Family history of epilepsy and other diseases of the nervous system: Secondary | ICD-10-CM

## 2015-09-20 DIAGNOSIS — A6000 Herpesviral infection of urogenital system, unspecified: Secondary | ICD-10-CM | POA: Diagnosis present

## 2015-09-20 DIAGNOSIS — K589 Irritable bowel syndrome without diarrhea: Secondary | ICD-10-CM | POA: Diagnosis present

## 2015-09-20 DIAGNOSIS — Z3490 Encounter for supervision of normal pregnancy, unspecified, unspecified trimester: Secondary | ICD-10-CM

## 2015-09-20 DIAGNOSIS — Z87891 Personal history of nicotine dependence: Secondary | ICD-10-CM

## 2015-09-20 DIAGNOSIS — O9832 Other infections with a predominantly sexual mode of transmission complicating childbirth: Secondary | ICD-10-CM | POA: Diagnosis present

## 2015-09-20 DIAGNOSIS — E282 Polycystic ovarian syndrome: Secondary | ICD-10-CM | POA: Diagnosis present

## 2015-09-20 DIAGNOSIS — Z880 Allergy status to penicillin: Secondary | ICD-10-CM

## 2015-09-20 DIAGNOSIS — O99214 Obesity complicating childbirth: Secondary | ICD-10-CM | POA: Diagnosis present

## 2015-09-20 DIAGNOSIS — Z8249 Family history of ischemic heart disease and other diseases of the circulatory system: Secondary | ICD-10-CM

## 2015-09-20 DIAGNOSIS — Z6841 Body Mass Index (BMI) 40.0 and over, adult: Secondary | ICD-10-CM

## 2015-09-20 DIAGNOSIS — O99824 Streptococcus B carrier state complicating childbirth: Secondary | ICD-10-CM | POA: Diagnosis present

## 2015-09-20 DIAGNOSIS — F418 Other specified anxiety disorders: Secondary | ICD-10-CM | POA: Diagnosis present

## 2015-09-20 DIAGNOSIS — Z833 Family history of diabetes mellitus: Secondary | ICD-10-CM

## 2015-09-20 DIAGNOSIS — O4202 Full-term premature rupture of membranes, onset of labor within 24 hours of rupture: Principal | ICD-10-CM | POA: Diagnosis present

## 2015-09-20 DIAGNOSIS — O2243 Hemorrhoids in pregnancy, third trimester: Secondary | ICD-10-CM | POA: Diagnosis present

## 2015-09-21 ENCOUNTER — Inpatient Hospital Stay (HOSPITAL_COMMUNITY): Payer: BLUE CROSS/BLUE SHIELD | Admitting: Anesthesiology

## 2015-09-21 ENCOUNTER — Encounter (HOSPITAL_COMMUNITY): Payer: Self-pay | Admitting: *Deleted

## 2015-09-21 DIAGNOSIS — O9962 Diseases of the digestive system complicating childbirth: Secondary | ICD-10-CM | POA: Diagnosis not present

## 2015-09-21 DIAGNOSIS — Z23 Encounter for immunization: Secondary | ICD-10-CM | POA: Diagnosis not present

## 2015-09-21 DIAGNOSIS — O99824 Streptococcus B carrier state complicating childbirth: Secondary | ICD-10-CM | POA: Diagnosis not present

## 2015-09-21 DIAGNOSIS — Z6841 Body Mass Index (BMI) 40.0 and over, adult: Secondary | ICD-10-CM | POA: Diagnosis not present

## 2015-09-21 DIAGNOSIS — E282 Polycystic ovarian syndrome: Secondary | ICD-10-CM | POA: Diagnosis not present

## 2015-09-21 DIAGNOSIS — K589 Irritable bowel syndrome without diarrhea: Secondary | ICD-10-CM | POA: Diagnosis not present

## 2015-09-21 DIAGNOSIS — Z3490 Encounter for supervision of normal pregnancy, unspecified, unspecified trimester: Secondary | ICD-10-CM

## 2015-09-21 DIAGNOSIS — Z8249 Family history of ischemic heart disease and other diseases of the circulatory system: Secondary | ICD-10-CM | POA: Diagnosis not present

## 2015-09-21 DIAGNOSIS — Z833 Family history of diabetes mellitus: Secondary | ICD-10-CM | POA: Diagnosis not present

## 2015-09-21 DIAGNOSIS — Z3A39 39 weeks gestation of pregnancy: Secondary | ICD-10-CM | POA: Diagnosis not present

## 2015-09-21 DIAGNOSIS — F418 Other specified anxiety disorders: Secondary | ICD-10-CM | POA: Diagnosis not present

## 2015-09-21 DIAGNOSIS — Z820 Family history of epilepsy and other diseases of the nervous system: Secondary | ICD-10-CM | POA: Diagnosis not present

## 2015-09-21 DIAGNOSIS — Z87891 Personal history of nicotine dependence: Secondary | ICD-10-CM | POA: Diagnosis not present

## 2015-09-21 DIAGNOSIS — Z880 Allergy status to penicillin: Secondary | ICD-10-CM | POA: Diagnosis not present

## 2015-09-21 DIAGNOSIS — O99344 Other mental disorders complicating childbirth: Secondary | ICD-10-CM | POA: Diagnosis not present

## 2015-09-21 DIAGNOSIS — O2243 Hemorrhoids in pregnancy, third trimester: Secondary | ICD-10-CM | POA: Diagnosis not present

## 2015-09-21 DIAGNOSIS — O9832 Other infections with a predominantly sexual mode of transmission complicating childbirth: Secondary | ICD-10-CM | POA: Diagnosis not present

## 2015-09-21 DIAGNOSIS — O4202 Full-term premature rupture of membranes, onset of labor within 24 hours of rupture: Secondary | ICD-10-CM | POA: Diagnosis not present

## 2015-09-21 DIAGNOSIS — O99214 Obesity complicating childbirth: Secondary | ICD-10-CM | POA: Diagnosis not present

## 2015-09-21 DIAGNOSIS — A6000 Herpesviral infection of urogenital system, unspecified: Secondary | ICD-10-CM | POA: Diagnosis not present

## 2015-09-21 LAB — CBC
HCT: 33.6 % — ABNORMAL LOW (ref 36.0–46.0)
Hemoglobin: 11.2 g/dL — ABNORMAL LOW (ref 12.0–15.0)
MCH: 28.3 pg (ref 26.0–34.0)
MCHC: 33.3 g/dL (ref 30.0–36.0)
MCV: 84.8 fL (ref 78.0–100.0)
Platelets: 157 10*3/uL (ref 150–400)
RBC: 3.96 MIL/uL (ref 3.87–5.11)
RDW: 14.2 % (ref 11.5–15.5)
WBC: 13 10*3/uL — ABNORMAL HIGH (ref 4.0–10.5)

## 2015-09-21 LAB — URINALYSIS, ROUTINE W REFLEX MICROSCOPIC
Bilirubin Urine: NEGATIVE
Glucose, UA: NEGATIVE mg/dL
Ketones, ur: 15 mg/dL — AB
Leukocytes, UA: NEGATIVE
Nitrite: NEGATIVE
Protein, ur: 30 mg/dL — AB
Specific Gravity, Urine: 1.01 (ref 1.005–1.030)
pH: 6.5 (ref 5.0–8.0)

## 2015-09-21 LAB — POCT FERN TEST: POCT Fern Test: POSITIVE

## 2015-09-21 LAB — URINE MICROSCOPIC-ADD ON

## 2015-09-21 LAB — TYPE AND SCREEN
ABO/RH(D): A POS
Antibody Screen: NEGATIVE

## 2015-09-21 LAB — RPR: RPR Ser Ql: NONREACTIVE

## 2015-09-21 LAB — ABO/RH: ABO/RH(D): A POS

## 2015-09-21 MED ORDER — LACTATED RINGERS IV SOLN: 500.0000 mL | INTRAVENOUS | Status: DC | PRN

## 2015-09-21 MED ORDER — DIBUCAINE 1 % RE OINT
1.0000 "application " | TOPICAL_OINTMENT | RECTAL | Status: DC | PRN
  Administered 2015-09-22: 1 via RECTAL
  Filled 2015-09-21: qty 28

## 2015-09-21 MED ORDER — EPHEDRINE 5 MG/ML INJ
10.0000 mg | INTRAVENOUS | Status: DC | PRN
  Filled 2015-09-21: qty 2

## 2015-09-21 MED ORDER — SOD CITRATE-CITRIC ACID 500-334 MG/5ML PO SOLN
30.0000 mL | ORAL | Status: DC | PRN
Start: 2015-09-21 — End: 2015-09-21

## 2015-09-21 MED ORDER — LACTATED RINGERS IV SOLN
500.0000 mL | Freq: Once | INTRAVENOUS | Status: AC
  Administered 2015-09-21: 500 mL via INTRAVENOUS

## 2015-09-21 MED ORDER — VANCOMYCIN HCL IN DEXTROSE 1-5 GM/200ML-% IV SOLN
1000.0000 mg | Freq: Two times a day (BID) | INTRAVENOUS | Status: DC
  Administered 2015-09-21: 1000 mg via INTRAVENOUS
  Filled 2015-09-21 (×3): qty 200

## 2015-09-21 MED ORDER — OXYCODONE-ACETAMINOPHEN 5-325 MG PO TABS: 2.0000 | ORAL_TABLET | ORAL | Status: DC | PRN

## 2015-09-21 MED ORDER — COCONUT OIL OIL
1.0000 "application " | TOPICAL_OIL | Status: DC | PRN
  Administered 2015-09-21: 1 via TOPICAL
  Filled 2015-09-21: qty 120

## 2015-09-21 MED ORDER — PHENYLEPHRINE 40 MCG/ML (10ML) SYRINGE FOR IV PUSH (FOR BLOOD PRESSURE SUPPORT)
80.0000 ug | PREFILLED_SYRINGE | INTRAVENOUS | Status: DC | PRN
  Filled 2015-09-21: qty 5

## 2015-09-21 MED ORDER — OXYTOCIN BOLUS FROM INFUSION
500.0000 mL | INTRAVENOUS | Status: DC
  Administered 2015-09-21: 500 mL via INTRAVENOUS

## 2015-09-21 MED ORDER — FENTANYL 2.5 MCG/ML BUPIVACAINE 1/10 % EPIDURAL INFUSION (WH - ANES)
14.0000 mL/h | INTRAMUSCULAR | Status: DC | PRN
  Administered 2015-09-21: 14 mL/h via EPIDURAL
  Filled 2015-09-21: qty 125

## 2015-09-21 MED ORDER — CLINDAMYCIN PHOSPHATE 900 MG/50ML IV SOLN: 900.0000 mg | Freq: Three times a day (TID) | INTRAVENOUS | Status: DC

## 2015-09-21 MED ORDER — OXYTOCIN 40 UNITS IN LACTATED RINGERS INFUSION - SIMPLE MED: 2.5000 [IU]/h | INTRAVENOUS | Status: DC

## 2015-09-21 MED ORDER — DIPHENHYDRAMINE HCL 50 MG/ML IJ SOLN
INTRAMUSCULAR | Status: AC
  Filled 2015-09-21: qty 1

## 2015-09-21 MED ORDER — OXYTOCIN 40 UNITS IN LACTATED RINGERS INFUSION - SIMPLE MED
1.0000 m[IU]/min | INTRAVENOUS | Status: DC
  Administered 2015-09-21: 2 m[IU]/min via INTRAVENOUS
  Filled 2015-09-21: qty 1000

## 2015-09-21 MED ORDER — LACTATED RINGERS IV SOLN: 500.0000 mL | Freq: Once | INTRAVENOUS | Status: AC

## 2015-09-21 MED ORDER — ONDANSETRON HCL 4 MG/2ML IJ SOLN: 4.0000 mg | INTRAMUSCULAR | Status: DC | PRN

## 2015-09-21 MED ORDER — ACETAMINOPHEN 325 MG PO TABS: 650.0000 mg | ORAL_TABLET | ORAL | Status: DC | PRN

## 2015-09-21 MED ORDER — ZOLPIDEM TARTRATE 5 MG PO TABS: 5.0000 mg | ORAL_TABLET | Freq: Every evening | ORAL | Status: DC | PRN

## 2015-09-21 MED ORDER — OXYCODONE-ACETAMINOPHEN 5-325 MG PO TABS: 1.0000 | ORAL_TABLET | ORAL | Status: DC | PRN

## 2015-09-21 MED ORDER — WITCH HAZEL-GLYCERIN EX PADS
1.0000 "application " | MEDICATED_PAD | CUTANEOUS | Status: DC | PRN
  Administered 2015-09-22: 1 via TOPICAL

## 2015-09-21 MED ORDER — BENZOCAINE-MENTHOL 20-0.5 % EX AERO
1.0000 "application " | INHALATION_SPRAY | CUTANEOUS | Status: DC | PRN
  Filled 2015-09-21 (×2): qty 56

## 2015-09-21 MED ORDER — LIDOCAINE HCL (PF) 1 % IJ SOLN
30.0000 mL | INTRAMUSCULAR | Status: DC | PRN
  Filled 2015-09-21: qty 30

## 2015-09-21 MED ORDER — LIDOCAINE HCL (PF) 1 % IJ SOLN
INTRAMUSCULAR | Status: DC | PRN
  Administered 2015-09-21 (×2): 6 mL

## 2015-09-21 MED ORDER — TETANUS-DIPHTH-ACELL PERTUSSIS 5-2.5-18.5 LF-MCG/0.5 IM SUSP: 0.5000 mL | Freq: Once | INTRAMUSCULAR | Status: DC

## 2015-09-21 MED ORDER — DIPHENHYDRAMINE HCL 25 MG PO CAPS: 25.0000 mg | ORAL_CAPSULE | Freq: Four times a day (QID) | ORAL | Status: DC | PRN

## 2015-09-21 MED ORDER — TERBUTALINE SULFATE 1 MG/ML IJ SOLN
0.2500 mg | Freq: Once | INTRAMUSCULAR | Status: DC | PRN
  Filled 2015-09-21: qty 1

## 2015-09-21 MED ORDER — DIPHENHYDRAMINE HCL 50 MG/ML IJ SOLN: 12.5000 mg | INTRAMUSCULAR | Status: DC | PRN

## 2015-09-21 MED ORDER — LACTATED RINGERS IV SOLN
INTRAVENOUS | Status: DC
  Administered 2015-09-21: 02:00:00 via INTRAVENOUS

## 2015-09-21 MED ORDER — BUTORPHANOL TARTRATE 1 MG/ML IJ SOLN
1.0000 mg | INTRAMUSCULAR | Status: DC | PRN
  Administered 2015-09-21 (×2): 1 mg via INTRAVENOUS
  Filled 2015-09-21 (×2): qty 1

## 2015-09-21 MED ORDER — ACETAMINOPHEN 325 MG PO TABS
650.0000 mg | ORAL_TABLET | ORAL | Status: DC | PRN
  Administered 2015-09-22 – 2015-09-23 (×5): 650 mg via ORAL
  Filled 2015-09-21 (×5): qty 2

## 2015-09-21 MED ORDER — SENNOSIDES-DOCUSATE SODIUM 8.6-50 MG PO TABS
2.0000 | ORAL_TABLET | ORAL | Status: DC
  Administered 2015-09-21 – 2015-09-22 (×2): 2 via ORAL
  Filled 2015-09-21 (×2): qty 2

## 2015-09-21 MED ORDER — FLEET ENEMA 7-19 GM/118ML RE ENEM: 1.0000 | ENEMA | RECTAL | Status: DC | PRN

## 2015-09-21 MED ORDER — PRENATAL MULTIVITAMIN CH
1.0000 | ORAL_TABLET | Freq: Every day | ORAL | Status: DC
  Administered 2015-09-22 – 2015-09-23 (×2): 1 via ORAL
  Filled 2015-09-21 (×2): qty 1

## 2015-09-21 MED ORDER — SIMETHICONE 80 MG PO CHEW: 80.0000 mg | CHEWABLE_TABLET | ORAL | Status: DC | PRN

## 2015-09-21 MED ORDER — ONDANSETRON HCL 4 MG PO TABS: 4.0000 mg | ORAL_TABLET | ORAL | Status: DC | PRN

## 2015-09-21 MED ORDER — PHENYLEPHRINE 40 MCG/ML (10ML) SYRINGE FOR IV PUSH (FOR BLOOD PRESSURE SUPPORT)
80.0000 ug | PREFILLED_SYRINGE | INTRAVENOUS | Status: DC | PRN
  Filled 2015-09-21: qty 10
  Filled 2015-09-21: qty 5

## 2015-09-21 MED ORDER — ONDANSETRON HCL 4 MG/2ML IJ SOLN
4.0000 mg | Freq: Four times a day (QID) | INTRAMUSCULAR | Status: DC | PRN
Start: 2015-09-21 — End: 2015-09-21

## 2015-09-21 MED ORDER — DIPHENHYDRAMINE HCL 50 MG/ML IJ SOLN
50.0000 mg | Freq: Once | INTRAMUSCULAR | Status: AC
  Administered 2015-09-21: 50 mg via INTRAVENOUS

## 2015-09-21 NOTE — H&P (Signed)
Lisa Howe is a 35 y.o. female presenting for PROM at 10pm last night.  Patient presented to MAU with no CTX at 2 am and was 3 cm dilated; no change from last SVE in office.  Antepartum course complicated by OCD and depression; stable on no meds.  She also has PCOS and this pregnancy was conceived on letrozole.  GBS positive; PCN allergy and resistant to Erythro.  Patient was started on pitocin and just received her epidural.    Maternal Medical History:  Reason for admission: Rupture of membranes.   Contractions: Onset was 6-12 hours ago.   Frequency: regular.   Perceived severity is moderate.    Fetal activity: Perceived fetal activity is normal.   Last perceived fetal movement was within the past hour.    Prenatal complications: no prenatal complications Prenatal Complications - Diabetes: none.    OB History    Gravida Para Term Preterm AB TAB SAB Ectopic Multiple Living   1 0 0 0 0 0 0 0 0 0      Past Medical History  Diagnosis Date  . HSV-1 (herpes simplex virus 1) infection   . Anxiety     panic attacks  . Migraines   . Depression   . OCD (obsessive compulsive disorder)   . IBS (irritable bowel syndrome)     not sure what caused it  . Excessive daytime sleepiness   . PCOS (polycystic ovarian syndrome)    Past Surgical History  Procedure Laterality Date  . Tonsillectomy    . Wisdom teeth      Family History: family history includes Cancer in her maternal grandmother and paternal grandmother; Diabetes in her paternal grandmother; Hyperlipidemia in her father, mother, and sister; Hypertension in her father and mother; Parkinson's disease in her maternal grandfather. Social History:  reports that she has quit smoking. Her smoking use included Cigarettes. She does not have any smokeless tobacco history on file. She reports that she drinks alcohol. She reports that she does not use illicit drugs.   Prenatal Transfer Tool  Maternal Diabetes: No Genetic Screening:  Normal Maternal Ultrasounds/Referrals: Normal Fetal Ultrasounds or other Referrals:  None Maternal Substance Abuse:  No Significant Maternal Medications:  None Significant Maternal Lab Results:  Lab values include: Group B Strep positive Other Comments:  None  ROS  Dilation: 5 Effacement (%): 100 Station: -1 Exam by:: D Jasso, RN Blood pressure 115/82, pulse 75, temperature 98.7 F (37.1 C), temperature source Axillary, resp. rate 20, height 5\' 6"  (1.676 m), weight 267 lb 12.8 oz (121.473 kg), last menstrual period 12/20/2014, SpO2 100 %. Maternal Exam:  Uterine Assessment: Contraction strength is moderate.  Contraction frequency is regular.   Abdomen: Fundal height is S>D.   Estimated fetal weight is 9#.       Physical Exam  Constitutional: She is oriented to person, place, and time. She appears well-developed and well-nourished.  GI: Soft. There is no rebound and no guarding.  Neurological: She is alert and oriented to person, place, and time.  Skin: Skin is warm and dry.  Psychiatric: She has a normal mood and affect. Her behavior is normal.    Prenatal labs: ABO, Rh: --/--/A POS (06/17 0113) Antibody: NEG (06/17 0113) Rubella: Immune (12/19 0000) RPR: Nonreactive (12/19 0000)  HBsAg: Negative (12/19 0000)  HIV: Non-reactive (12/19 0000)  GBS: Positive (05/30 0000)   Assessment/Plan: 35yo G1 at [redacted]w[redacted]d with PROM -Pitocin augmentation -Vanc for PCN for ppx -Anticipate NSVD   Lisa Howe 09/21/2015,  8:21 AM

## 2015-09-21 NOTE — Anesthesia Pain Management Evaluation Note (Signed)
  CRNA Pain Management Visit Note  Patient: Lisa Howe, 35 y.o., female  "Hello I am a member of the anesthesia team at [facility]. We have an anesthesia team available at all times to provide care throughout the hospital, including epidural management and anesthesia for C-section. I don't know your plan for the delivery whether it a natural birth, water birth, IV sedation, nitrous supplementation, doula or epidural, but we want to meet your pain goals."   1.Was your pain managed to your expectations on prior hospitalizations?   No prior hospitalizations  2.What is your expectation for pain management during this hospitalization?     Epidural  3.How can we help you reach that goal? Epidural  Record the patient's initial score and the patient's pain goal.   Pain: 9  Pain Goal: 5 [facility] wants you to be able to say your pain was always managed very well.  Casimer Lanius 09/21/2015

## 2015-09-21 NOTE — Lactation Note (Signed)
This note was copied from a baby's chart. Lactation Consultation Note  Patient Name: Lisa Howe [id number] Date: 09/21/2015 Reason for consult: Initial assessment Mom reports some mild nipple tenderness, no breakdown observed. Encouraged to put EBM on nipple, RN bringing coconut oil. Baby asleep at this visit, did not see latch. Mom and RN report baby is now latching well. Encouraged to BF with feeding ques, Encouraged to call for assist with latch due to nipple tenderness. Mom has history of PCOS, reports some early breast changes, slight tubular shape to breast. Lactation brochure left for review, advised of OP services and support group.   Maternal Data Has patient been taught Hand Expression?: Yes Does the patient have breastfeeding experience prior to this delivery?: No  Feeding Feeding Type: Breast Fed Length of feed: 6 min  LATCH Score/Interventions Latch: Grasps breast easily, tongue down, lips flanged, rhythmical sucking. Intervention(s): Adjust position;Assist with latch;Breast massage;Breast compression  Audible Swallowing: A few with stimulation Intervention(s): Skin to skin  Type of Nipple: Everted at rest and after stimulation Intervention(s): Reverse pressure  Comfort (Breast/Nipple): Filling, red/small blisters or bruises, mild/mod discomfort  Problem noted: Mild/Moderate discomfort Interventions (Mild/moderate discomfort): Hand expression (coconut oil)  Hold (Positioning): Assistance needed to correctly position infant at breast and maintain latch.  LATCH Score: 7  Lactation Tools Discussed/Used     Consult Status Consult Status: Follow-up Date: 09/22/15 Follow-up type: In-patient    Katrine Coho 09/21/2015, 10:01 PM

## 2015-09-21 NOTE — MAU Note (Signed)
Leaking clear fld since 2200. On occ sees slight pink tinge to fld. Some cramping in lower back. 3cm last sve on WEds

## 2015-09-21 NOTE — Anesthesia Preprocedure Evaluation (Signed)
Anesthesia Evaluation  Patient identified by MRN, date of birth, ID band Patient awake    Reviewed: Allergy & Precautions, NPO status , Patient's Chart, lab work & pertinent test results  Airway Mallampati: II  TM Distance: >3 FB Neck ROM: Full    Dental no notable dental hx.    Pulmonary former smoker,    Pulmonary exam normal breath sounds clear to auscultation       Cardiovascular negative cardio ROS Normal cardiovascular exam Rhythm:Regular Rate:Normal     Neuro/Psych  Headaches, PSYCHIATRIC DISORDERS Anxiety Depression    GI/Hepatic negative GI ROS, Neg liver ROS,   Endo/Other  Morbid obesity  Renal/GU negative Renal ROS  negative genitourinary   Musculoskeletal negative musculoskeletal ROS (+)   Abdominal (+) + obese,   Peds negative pediatric ROS (+)  Hematology negative hematology ROS (+)   Anesthesia Other Findings   Reproductive/Obstetrics negative OB ROS                             Anesthesia Physical Anesthesia Plan  ASA: III  Anesthesia Plan: Epidural   Post-op Pain Management:    Induction: Intravenous  Airway Management Planned: Natural Airway  Additional Equipment:   Intra-op Plan:   Post-operative Plan:   Informed Consent: I have reviewed the patients History and Physical, chart, labs and discussed the procedure including the risks, benefits and alternatives for the proposed anesthesia with the patient or authorized representative who has indicated his/her understanding and acceptance.   Dental advisory given  Plan Discussed with: CRNA  Anesthesia Plan Comments: (Informed consent obtained prior to proceeding including risk of failure, 1% risk of PDPH, risk of minor discomfort and bruising.  Discussed rare but serious complications including epidural abscess, permanent nerve injury, epidural hematoma.  Discussed alternatives to epidural analgesia and patient  desires to proceed.  Timeout performed pre-procedure verifying patient name, procedure, and platelet count.  Patient tolerated procedure well. )        Anesthesia Quick Evaluation

## 2015-09-21 NOTE — Anesthesia Procedure Notes (Signed)
Epidural Patient location during procedure: OB  Staffing Anesthesiologist: Franne Grip  Preanesthetic Checklist Completed: patient identified, site marked, surgical consent, pre-op evaluation, timeout performed, IV checked, risks and benefits discussed and monitors and equipment checked  Epidural Patient position: sitting Prep: DuraPrep Patient monitoring: heart rate and blood pressure Approach: midline Location: L3-L4 Injection technique: LOR saline  Needle:  Needle type: Tuohy  Needle gauge: 17 G Needle length: 9 cm Needle insertion depth: 6 cm Catheter type: closed end flexible Catheter size: 19 Gauge Catheter at skin depth: 13 cm Test dose: negative and Other  Assessment Events: blood not aspirated, injection not painful, no injection resistance, negative IV test and no paresthesia  Additional Notes Reason for block:procedure for pain

## 2015-09-21 NOTE — Progress Notes (Signed)
CSW met with Pt and family to assess for need for services. Per MOB, she has been stable and off medications for 3-4 years, managing her depression. She also has an established provider who can prescribe medications or refer her to an outpatient therapist if Pt's symptoms worsen. Pt reports that she is aware of warning signs for postpartum depression and feels that her support system is appropriate. Therefore, CSW is screening out this consult.   Tomasa Dobransky Carter, LCSWA Clinical Social Work [phone]    

## 2015-09-22 ENCOUNTER — Encounter (HOSPITAL_COMMUNITY): Payer: Self-pay

## 2015-09-22 LAB — CBC
HCT: 31 % — ABNORMAL LOW (ref 36.0–46.0)
Hemoglobin: 10.1 g/dL — ABNORMAL LOW (ref 12.0–15.0)
MCH: 28.5 pg (ref 26.0–34.0)
MCHC: 32.6 g/dL (ref 30.0–36.0)
MCV: 87.6 fL (ref 78.0–100.0)
Platelets: 133 10*3/uL — ABNORMAL LOW (ref 150–400)
RBC: 3.54 MIL/uL — ABNORMAL LOW (ref 3.87–5.11)
RDW: 14.4 % (ref 11.5–15.5)
WBC: 13.5 10*3/uL — ABNORMAL HIGH (ref 4.0–10.5)

## 2015-09-22 NOTE — Lactation Note (Addendum)
This note was copied from a baby's chart. Lactation Consultation Note  Patient Name: Lisa Howe [id number] Date: 09/22/2015 Reason for consult: Follow-up assessment;Breast/nipple pain Mom c/o of nipple pain with nursing PS=3-4. Baby nursing when [facility] arrived, baby appears to be shallow on the nipple, crease present. Re-adjusted baby and demonstrated breast compression to help obtain depth with latch. Mom reports slight improvement on right breast. Baby has been cluster feeding this afternoon, left nipple is bruised with beginning of superficial cracking. Mom applying EBM/coconut oil for comfort. Mom asked about nipple shield but baby now asleep. Comfort gels given due to nipple trauma. Mom is going to work on obtaining more depth with latch and improving positioning, un-tucking lower lip. If nipple pain does not improve or is becoming worse, Mom to call for [facility] and we will re-visit using the nipple shield.   Maternal Data    Feeding Feeding Type: Breast Fed Length of feed: 35 min  LATCH Score/Interventions             Problem noted: Cracked, bleeding, blisters, bruises Interventions  (Cracked/bleeding/bruising/blister): Expressed breast milk to nipple (coconut oil) Interventions (Mild/moderate discomfort): Comfort gels        Lactation Tools Discussed/Used Tools: Comfort gels   Consult Status Date: 09/22/15 Follow-up type: In-patient    Katrine Coho 09/22/2015, 4:07 PM

## 2015-09-22 NOTE — Progress Notes (Signed)
Post Partum Day 1 Subjective: no complaints, up ad lib, voiding and tolerating PO  Objective: Blood pressure 121/80, pulse 81, temperature 98.8 F (37.1 C), temperature source Oral, resp. rate 20, height 5\' 6"  (1.676 m), weight 267 lb 12.8 oz (121.473 kg), last menstrual period 12/20/2014, SpO2 100 %, unknown if currently breastfeeding.  Physical Exam:  General: alert, cooperative and appears stated age Lochia: appropriate Uterine Fundus: firm Incision: healing well, no significant drainage, no dehiscence DVT Evaluation: No evidence of DVT seen on physical exam. Negative Homan's sign. No cords or calf tenderness.   Recent Labs  09/21/15 0113 09/22/15 0554  HGB 11.2* 10.1*  HCT 33.6* 31.0*    Assessment/Plan: Plan for discharge tomorrow and Breastfeeding   LOS: 1 day   Lisa Howe 09/22/2015, 11:03 AM

## 2015-09-22 NOTE — Anesthesia Postprocedure Evaluation (Signed)
Anesthesia Post Note  Patient: Lisa Howe  Procedure(s) Performed: * No procedures listed *  Patient location during evaluation: Mother Baby Anesthesia Type: Epidural Level of consciousness: awake Pain management: satisfactory to patient Vital Signs Assessment: post-procedure vital signs reviewed and stable Respiratory status: spontaneous breathing Cardiovascular status: stable Anesthetic complications: no     Last Vitals:  Filed Vitals:   09/21/15 2359 09/22/15 0552  BP: 140/86 121/80  Pulse: 90 81  Temp: 36.9 C 37.1 C  Resp: 18 20    Last Pain:  Filed Vitals:   09/22/15 1304  PainSc: 0-No pain   Pain Goal: Patients Stated Pain Goal: 0 (09/22/15 1011)               Casimer Lanius

## 2015-09-22 NOTE — Lactation Note (Signed)
This note was copied from a baby's chart. Lactation Consultation Note  Patient Name: Lisa Howe [id number] Date: 09/22/2015 Reason for consult: Follow-up assessment;Breast/nipple pain F/u with Mom, RN had reported to [facility] that Mom requesting nipple shield due to nipple pain. Mom reports she and RN tried #20 nipple shield but baby would not latch with it. Offered to try while at this visit, Mom declined. Mom reports she feels she is getting a better latch and discomfort is getting better. Mom reports baby has been at the breast all day, some cluster feeding but for short periods of time. No colostrum present with hand expression at this visit and Mom tried to use hand pump and did not receive any breast milk, LC advised Mom it is normal not to see colostrum with breast pump but would like to see some with hand expression. Baby has good output so feel she is getting some colostrum with nursing, some good suckling bursts observed with swallowing motions few times but no audible swallows. Mom has history of PCOS/infertility, has some spacing between breasts with tubular shape to breast. Not much glandular tissue palpable underside of breast. Due to history encouraged Mom to start post pumping, she wants to use her own DEBP so will use manual pump here. Encouraged to post pump for 15 minutes after some feedings. Discussed possible supplementing till her milk comes to volume especially if baby not satisfied at breast or not waking to BF or sustaining latch for 15-20 minutes with most feedings. Mom will consider but not ready at this time. Discussed with RN that if Mom decides to supplement to use 5 fr feeding tube/syringe at breast if possible. At this point Mom to keep BF with feeding ques, keep baby actively nursing for 15-20 minutes both breasts some feedings. Post pump when she can for 15 minutes to encourage milk production and try to obtain EBM to supplement. Call for questions/concerns.   Maternal Data     Feeding Feeding Type: Breast Fed Length of feed: 10 min (off/on)  LATCH Score/Interventions Latch: Grasps breast easily, tongue down, lips flanged, rhythmical sucking.  Audible Swallowing: A few with stimulation Intervention(s): Skin to skin  Type of Nipple: Everted at rest and after stimulation Intervention(s): Hand pump  Comfort (Breast/Nipple): Filling, red/small blisters or bruises, mild/mod discomfort  Problem noted: Mild/Moderate discomfort;Cracked, bleeding, blisters, bruises Interventions  (Cracked/bleeding/bruising/blister): Expressed breast milk to nipple Interventions (Mild/moderate discomfort): Comfort gels (coconut oil)  Hold (Positioning): Assistance needed to correctly position infant at breast and maintain latch. Intervention(s): Breastfeeding basics reviewed;Support Pillows;Skin to skin  LATCH Score: 7  Lactation Tools Discussed/Used Tools: Pump;Nipple Shields Nipple shield size: 20 Breast pump type: Manual   Consult Status Consult Status: Follow-up Date: 09/23/15 Follow-up type: In-patient    Lisa Howe 09/22/2015, 8:13 PM

## 2015-09-23 LAB — COMPREHENSIVE METABOLIC PANEL
ALT: 21 U/L (ref 14–54)
AST: 32 U/L (ref 15–41)
Albumin: 2.8 g/dL — ABNORMAL LOW (ref 3.5–5.0)
Alkaline Phosphatase: 83 U/L (ref 38–126)
Anion gap: 7 (ref 5–15)
BUN: 8 mg/dL (ref 6–20)
CO2: 24 mmol/L (ref 22–32)
Calcium: 8.6 mg/dL — ABNORMAL LOW (ref 8.9–10.3)
Chloride: 107 mmol/L (ref 101–111)
Creatinine, Ser: 0.6 mg/dL (ref 0.44–1.00)
GFR calc Af Amer: >60 mL/min (ref >60)
GFR calc non Af Amer: >60 mL/min (ref >60)
Glucose, Bld: 84 mg/dL (ref 65–99)
Potassium: 4.1 mmol/L (ref 3.5–5.1)
Sodium: 138 mmol/L (ref 135–145)
Total Bilirubin: 0.5 mg/dL (ref 0.3–1.2)
Total Protein: 6.5 g/dL (ref 6.5–8.1)

## 2015-09-23 LAB — CBC
HCT: 30.9 % — ABNORMAL LOW (ref 36.0–46.0)
Hemoglobin: 10 g/dL — ABNORMAL LOW (ref 12.0–15.0)
MCH: 28.2 pg (ref 26.0–34.0)
MCHC: 32.4 g/dL (ref 30.0–36.0)
MCV: 87.3 fL (ref 78.0–100.0)
Platelets: 148 10*3/uL — ABNORMAL LOW (ref 150–400)
RBC: 3.54 MIL/uL — ABNORMAL LOW (ref 3.87–5.11)
RDW: 14.4 % (ref 11.5–15.5)
WBC: 9.6 10*3/uL (ref 4.0–10.5)

## 2015-09-23 MED ORDER — HYDROCORTISONE ACE-PRAMOXINE 1-1 % RE FOAM
1.0000 | Freq: Two times a day (BID) | RECTAL | Status: DC
  Administered 2015-09-23: 1 via RECTAL
  Filled 2015-09-23: qty 10

## 2015-09-23 NOTE — Progress Notes (Signed)
Post Partum Day 2 Subjective: up ad lib, voiding, tolerating PO, + flatus and complains of hemorrhoids, denies HA, blurred vision  Objective: Blood pressure 134/98, pulse 71, temperature 98.2 F (36.8 C), temperature source Oral, resp. rate 18, height 5\' 6"  (1.676 m), weight 267 lb 12.8 oz (121.473 kg), last menstrual period 12/20/2014, SpO2 100 %, unknown if currently breastfeeding.  Physical Exam:  General: alert and cooperative Lochia: appropriate Uterine Fundus: firm Incision: healing well, small non- thrombosed hemorrhoids noted DVT Evaluation: No evidence of DVT seen on physical exam. Negative Homan's sign. No cords or calf tenderness. Calf/Ankle edema is present.   Recent Labs  09/21/15 0113 09/22/15 0554  HGB 11.2* 10.1*  HCT 33.6* 31.0*    Assessment/Plan: Discharge home  Sitz bath and proctofoam HC  PIH labs ordered, as bp rechecked noted to be increasing. Reviewed signs and symptoms of PIH   LOS: 2 days   CURTIS,CAROL G 09/23/2015, 8:52 AM

## 2015-09-23 NOTE — Lactation Note (Addendum)
This note was copied from a baby's chart. Lactation Consultation Note New mom having difficulty satisfying baby. Mom has PCOS w/tubular breast. Hand express w/no colostrum noted. Small nipples red and tender. Has comfort gels. Mom requested SNS as previous LC reccommended. Mom had NS, stated the baby didn't like it. Moms tubular breast w/nipple at the bottom of breast probably is hard to stay on d/t lack of breast tissue. Demonstrated SNS to parents the set up and latched baby in football position. Baby BF approx. 10 min. Baby became fussy and unlatched, screaming. Burped baby, FOB changes diaper, attempted to re-latch baby, wouldn't re-latch. Swaddled baby, syring fed baby 42ml formula. Baby satisfied and quiet. Taught cleaning of SNS to FOB. Mom very tearful while baby screaming. Hand expressed mom, no colostrum noted. Breast soft. No difference after BF w/SNS. Encouraged mom to assess breast before and after BF for difference. Encouraged mom to use DEBP for stimulation, states she had one at home, stressed to use hospital pump until went home for stimulation for lactation induction. Mom didn't want to use hospital pump. Has hand pump and will use that. Taught syring feeding. Discussed options of feeding until moms milk came in. Mom had several questions about PCOS and BF. Stressed again pumping for stimulation. discussed probability of supplementing until milk came. Encouraged SNS for breast stimulation and nutrition. Discussed syring feeding ok for a day or so w/small amounts but difficult for large volume of formula.  Baby had 7% weight loss. Mom stated baby BF a lot during the day. Has had  12 voids, 9 stools, several emesis since birth. A very amount of output which probably cause of weight loss. With this amount of large output, probably getting something from mom. Discussed cluster feeding. Encouraged parents to rest while baby is resting. Take one day at a time. Feeding plan may and probably will change  from day to day as needed until milk comes in and feeding established. Parents satisfied w/SNS plan at this time.  Patient Name: Girl Madysun Boniface [id number] Date: 09/23/2015 Reason for consult: Follow-up assessment;Difficult latch;Breast/nipple pain;Infant weight loss   Maternal Data    Feeding Feeding Type: Formula Length of feed: 10 min  LATCH Score/Interventions Latch: Repeated attempts needed to sustain latch, nipple held in mouth throughout feeding, stimulation needed to elicit sucking reflex. Intervention(s): Adjust position;Assist with latch;Breast massage;Breast compression  Audible Swallowing: None Intervention(s): Skin to skin;Hand expression  Type of Nipple: Everted at rest and after stimulation Intervention(s): Hand pump  Comfort (Breast/Nipple): Filling, red/small blisters or bruises, mild/mod discomfort  Problem noted: Mild/Moderate discomfort Interventions  (Cracked/bleeding/bruising/blister): Hand pump Interventions (Mild/moderate discomfort): Hand massage;Post-pump;Comfort gels;Breast shields  Hold (Positioning): Assistance needed to correctly position infant at breast and maintain latch. Intervention(s): Skin to skin;Position options;Support Pillows;Breastfeeding basics reviewed  LATCH Score: 5  Lactation Tools Discussed/Used Tools: Nipple [place] Fuel;Supplemental Nutrition System;Other (comment) Breast pump type: Manual Pump Review: Setup, frequency, and cleaning;Milk Storage Initiated by:: RN Date initiated:: 09/22/15   Consult Status Consult Status: Follow-up Date: 09/23/15 Follow-up type: In-patient    Theodoro Kalata 09/23/2015, 3:17 AM

## 2015-09-23 NOTE — Lactation Note (Addendum)
This note was copied from a baby's chart. Lactation Consultation Note  Patient Name: Lisa Howe [id number] Date: 09/23/2015 Reason for consult: Follow-up assessment   With this mom and baby, now 47 days old, and needing SNS to feed. On exam of mom's breasts, they are soft with oittle breast tissue, but mom reports feeling fuller today. I was able to express a small drop of milk from each breast. Mom cried tears of joy!. Mom and baby are being discharged to home today. I showed mom how to use the long time use SNS. Mom liked this system and felt comfortable breastfeeding her baby with this. I also showed paretns how to gently stimulate Paisley to keep her awake with feeds. She took a total of 15 ml's formula, which is more than she has been taking. I advised mom to keep a close watch on baby's output, and to increase amounts of formula offered to baby, as she wants more to eat.  I also made an o/p lactation appointment for mom , for 6/22, at 1430.Mom plans to begin pumping when home, with DEP.  Mom knows to call for questions/concerns.    Maternal Data    Feeding Feeding Type: Breast Fed  LATCH Score/Interventions Latch: Grasps breast easily, tongue down, lips flanged, rhythmical sucking.  Audible Swallowing: Spontaneous and intermittent (with formula) Intervention(s): Skin to skin;Hand expression (small drops of colostrum seen from each breast)  Type of Nipple: Everted at rest and after stimulation  Comfort (Breast/Nipple): Filling, red/small blisters or bruises, mild/mod discomfort (little breast tissue, pie shaped breasts, wide spaced , hx PCOS)  Problem noted: Filling Interventions  (Cracked/bleeding/bruising/blister): Expressed breast milk to nipple  Hold (Positioning): Assistance needed to correctly position infant at breast and maintain latch. Intervention(s): Breastfeeding basics reviewed;Support Pillows;Position options;Skin to skin  LATCH Score: 8  Lactation Tools  Discussed/Used     Consult Status Consult Status: Follow-up Date: 09/26/15 Follow-up type: In-patient    Tonna Corner 09/23/2015, 2:05 PM

## 2015-09-23 NOTE — Progress Notes (Signed)
Addendum:  BP rechecked, elevated PIH labs ordered, awaiting results

## 2015-09-26 ENCOUNTER — Ambulatory Visit (HOSPITAL_COMMUNITY): Payer: No Typology Code available for payment source

## 2015-09-27 ENCOUNTER — Telehealth (HOSPITAL_COMMUNITY): Payer: Self-pay

## 2015-09-27 NOTE — Discharge Summary (Signed)
Obstetric Discharge Summary Reason for Admission: rupture of membranes Prenatal Procedures: ultrasound Intrapartum Procedures: spontaneous vaginal delivery Postpartum Procedures: none Complications-Operative and Postpartum: 2 degree perineal laceration HEMOGLOBIN  Date Value Ref Range Status  09/23/2015 10.0* 12.0 - 15.0 g/dL Final   HEMOGLOBIN, FINGERSTICK  Date Value Ref Range Status  07/18/2012 13.6 12.0 - 16.0 g/dL Final   HCT  Date Value Ref Range Status  09/23/2015 30.9* 36.0 - 46.0 % Final    Physical Exam:  General: alert and cooperative Lochia: appropriate Uterine Fundus: firm Incision: healing well DVT Evaluation: No evidence of DVT seen on physical exam. Negative Homan's sign. No cords or calf tenderness. Calf/Ankle edema is present.  Discharge Diagnoses: Term Pregnancy-delivered  Discharge Information: Date: 09/27/2015 Activity: pelvic rest Diet: routine Medications: PNV and Ibuprofen Condition: stable Instructions: refer to practice specific booklet Discharge to: home   Newborn Data: Live born female  Birth Weight: 9 lb 1.7 oz (4131 g) APGAR: 8, 9  Home with mother.  Tristin Gladman G 09/27/2015, 8:20 AM

## 2015-09-27 NOTE — Telephone Encounter (Signed)
Mom has 6day old baby with concerns about milk supply not increasing.  Mom has history of PCOS and report no changes in breasts during pregnancy beside some soreness.  Baby is getting formula fed by bottle.  Mom has tried SNS and baby did not do well.  LC discussed protecting milk supply and she has started fenugreek today per her DR. Instructions.  Encouraged mom to keep with plan to increase volume and has appointment on 6/29.  Mom encouraged to rest, LC was able to hear commotion in house.  Mom did write plan as we were discussing.

## 2015-10-03 ENCOUNTER — Ambulatory Visit (HOSPITAL_COMMUNITY)
Admission: RE | Admit: 2015-10-03 | Discharge: 2015-10-03 | Disposition: A | Discharge status: Home / Self Care | Payer: BLUE CROSS/BLUE SHIELD | Source: Ambulatory Visit | Attending: Obstetrics & Gynecology | Admitting: Obstetrics & Gynecology

## 2015-10-03 NOTE — Lactation Note (Signed)
Lactation Consult  Mother's reason for visit: 2 weeks PP and milk supply still not full ( PCOS )  Visit Type: feeding assessment  Appointment Notes: left message - confirmed  Consult:  Initial Lactation Consultant:  Rudolpho Sevin is 66 days old , today's weight - 9-2 oz, mom has hx of PCOS , and per mom  Had a lot of swelling her feed , has improved and since then breast are fuller but milk really hasn't come in  At 12 days. Mom is very motivated to breast feed but is aware due to PCOS - her milk supply won't be adequate  Solely for the baby to grow,  And is aware supplementing with formula is necessary.  Per mom baby feeds every 2-3 hours per side for 10 -15 mins , and then I supplement with a bottle due to the baby not liking the  SNS with the bottle. She liked the purple tube / syringe better/  And did well with it in the hospital. Was not discharged with it.  Baby latches well most of the time , supply isn't enough for her.  8-10 wets, 3-4 dark yellow stools,  Pumping - with DEBP Medela - every 2-3 hours - with .25 to .50 oz return per side.  Supplementing with Similac formula - 60 ml after breast feeding - and aware to increase as needed with feeding cues.  Slow flow Dr. Owens Shark nipple  Per mom taking Fenugreek 3 capsules 3 x's a day and has noticed some difference in fullness of breast.  See [place] plan below     ________________________________________________________________________ [id number] Name: Lisa Howe Date of Birth: 09/21/2013 Pediatrician:Dr. Lennie Hummer [facility])  Gender: female Gestational Age: [redacted]w[redacted]d (At Birth) Birth Weight: 9 lb 1.7 oz (4131 g) Weight at Discharge: Weight: 8 lb 7.1 oz (3830 g)Date of Discharge: 09/23/2015 Filed Weights   09/21/15 1101 09/22/15 0021 09/23/15 0000  Weight: 9 lb 1.7 oz (4131 g) 8 lb 13.6 oz (4015 g) 8 lb 7.1 oz (3830 g)   Last weight taken from location outside of [facility]:6/26 8-15 oz  Location:Pediatrician's office Weight today:9-2 oz 4158 g     ______________________________________________________________________  Mother's Name: Lisa Howe Type of delivery:   Breastfeeding Experience:  1st baby  Maternal Medical Conditions:  Polycystic ovarian syndrome Maternal Medications:  Fenugreek ,PNV ,  _______________________________________________________________________ I________________________________________________________________________  Maternal Breast Assessment  Breast:  Soft and Filling Nipple:  Erect Pain level:  0 Pain interventions:  Expressed breast milk  _______________________________________________________________________ Feeding Assessment/Evaluation  Initial feeding assessment:  Infant's oral assessment:  Baby stretches tongue over gum line.   Positioning:  Cross cradle Left breast  LATCH documentation:  Latch:  2 = Grasps breast easily, tongue down, lips flanged, rhythmical sucking.  Audible swallowing:  2 = Spontaneous and intermittent  Type of nipple:  2 = Everted at rest and after stimulation  Comfort (Breast/Nipple):  2 = Soft / non-tender  Hold (Positioning):  1 = Assistance needed to correctly position infant at breast and maintain latch  LATCH score:  9   Attached assessment:  Deep  Lips flanged:  No.  Lips untucked:  No.  Suck assessment:  Nutritive and Nonnutritive  Tools:  None   IPre-feed weight: 4158 g , 9-2 oz  Post-feed weight:  4172 g , 9-3.2 oz  Amount transferred:  14 ml  Amount supplemented:  None   Additional Feeding Assessment -   Infant's oral assessment:  See above note   Positioning:  Football Right breast  LATCH documentation:  Latch:  2 = Grasps breast easily, tongue down, lips flanged, rhythmical sucking.  Audible swallowing:  2 = Spontaneous and intermittent  Type of nipple:  2 - erect   Comfort (Breast/Nipple):  2 = Soft / non-tender  Hold (Positioning):   2 = No assistance needed to correctly position infant at breast  LATCH score: 10   Attached assessment:  Deep  Lips flanged:  Yes.    Lips untucked:  Yes.    Suck assessment:  Nutritive and Nonnutritive  Tools:  Pump - post pumped after feeding both breast - 6 ml  Instructed on use and cleaning of tool:  Yes.    Pre-feed weight:  4172 g , 9-3.2 oz  Post-feed weight:  4172 g , 9-3.1 oz  Amount transferred:  Zero  Amount supplemented:  30 ml    Total amount pumped post feed:  6 ml  Total amount transferred: 14 ml  Total supplement given:  30 ml   Lactation Plan of care:  Praised mom for  Her efforts breast feeding , and dads support  Mom - plenty of fluids , especially water, nutritious calories - meals , snacks,  Rest , naps  Continue Fenugreek , consider Brewers yeast - from the hand out on low milk supply And other herbs recommended  Option #1  Feed the baby both breast , supplement , post pump 10 -20 mins . ( supplement 2-3 oz )  Option #2  Feed the baby with SNS ( purple syringe and 5 F - parents aware of how to use it  And clean)  Pumping both breast for stimulation in 24 hours - min of 8 x's ( 8-12 would be better ) - enhance milk supply.

## 2015-10-28 DIAGNOSIS — Z1389 Encounter for screening for other disorder: Secondary | ICD-10-CM | POA: Diagnosis not present

## 2016-01-23 DIAGNOSIS — Z13220 Encounter for screening for lipoid disorders: Secondary | ICD-10-CM | POA: Diagnosis not present

## 2016-01-23 DIAGNOSIS — E559 Vitamin D deficiency, unspecified: Secondary | ICD-10-CM | POA: Diagnosis not present

## 2016-01-23 DIAGNOSIS — Z0389 Encounter for observation for other suspected diseases and conditions ruled out: Secondary | ICD-10-CM | POA: Diagnosis not present

## 2016-01-23 DIAGNOSIS — R5383 Other fatigue: Secondary | ICD-10-CM | POA: Diagnosis not present

## 2016-01-23 DIAGNOSIS — F329 Major depressive disorder, single episode, unspecified: Secondary | ICD-10-CM | POA: Diagnosis not present

## 2016-01-23 DIAGNOSIS — Z0000 Encounter for general adult medical examination without abnormal findings: Secondary | ICD-10-CM | POA: Diagnosis not present

## 2016-01-23 DIAGNOSIS — Z23 Encounter for immunization: Secondary | ICD-10-CM | POA: Diagnosis not present

## 2016-01-23 DIAGNOSIS — E282 Polycystic ovarian syndrome: Secondary | ICD-10-CM | POA: Diagnosis not present

## 2016-01-23 DIAGNOSIS — E669 Obesity, unspecified: Secondary | ICD-10-CM | POA: Diagnosis not present

## 2016-01-23 DIAGNOSIS — R739 Hyperglycemia, unspecified: Secondary | ICD-10-CM | POA: Diagnosis not present

## 2016-01-28 DIAGNOSIS — R300 Dysuria: Secondary | ICD-10-CM | POA: Diagnosis not present

## 2016-04-06 NOTE — L&D Delivery Note (Signed)
I was called for delivery at the request of Dr. Lynnette Caffey who was unavailable in a stat CS  Delivery Note At 10:22 AM a viable and healthy child was delivered via Vaginal, Spontaneous (Presentation: direct OP). APGAR:8, 9; weight pending.   Placenta status: delivered intact via Delena Bali. Cord: 3V with the following complications: none. Cord pH: n/a  Anesthesia: Epidural Episiotomy: None Lacerations: 2nd degree perineal, Rt labial-hemostatic Suture Repair: 2.0 vicryl rapide Est. Blood Loss (mL): 200  Mom to postpartum.  Baby to Couplet care / Skin to Skin.  Julianne Handler, CNM 02/18/2017, 10:48 AM

## 2016-06-16 DIAGNOSIS — R309 Painful micturition, unspecified: Secondary | ICD-10-CM | POA: Diagnosis not present

## 2016-06-16 DIAGNOSIS — N3000 Acute cystitis without hematuria: Secondary | ICD-10-CM | POA: Diagnosis not present

## 2016-07-08 DIAGNOSIS — J019 Acute sinusitis, unspecified: Secondary | ICD-10-CM | POA: Diagnosis not present

## 2016-07-08 DIAGNOSIS — J209 Acute bronchitis, unspecified: Secondary | ICD-10-CM | POA: Diagnosis not present

## 2016-07-15 DIAGNOSIS — N911 Secondary amenorrhea: Secondary | ICD-10-CM | POA: Diagnosis not present

## 2016-07-24 DIAGNOSIS — Z3480 Encounter for supervision of other normal pregnancy, unspecified trimester: Secondary | ICD-10-CM | POA: Diagnosis not present

## 2016-07-24 DIAGNOSIS — Z36 Encounter for antenatal screening for chromosomal anomalies: Secondary | ICD-10-CM | POA: Diagnosis not present

## 2016-07-24 DIAGNOSIS — Z3490 Encounter for supervision of normal pregnancy, unspecified, unspecified trimester: Secondary | ICD-10-CM | POA: Diagnosis not present

## 2016-07-24 DIAGNOSIS — Z3491 Encounter for supervision of normal pregnancy, unspecified, first trimester: Secondary | ICD-10-CM | POA: Diagnosis not present

## 2016-07-24 DIAGNOSIS — Z3685 Encounter for antenatal screening for Streptococcus B: Secondary | ICD-10-CM | POA: Diagnosis not present

## 2016-07-24 DIAGNOSIS — Z3481 Encounter for supervision of other normal pregnancy, first trimester: Secondary | ICD-10-CM | POA: Diagnosis not present

## 2016-07-24 LAB — OB RESULTS CONSOLE HEPATITIS B SURFACE ANTIGEN: Hepatitis B Surface Ag: NEGATIVE

## 2016-07-24 LAB — OB RESULTS CONSOLE ABO/RH: RH Type: POSITIVE

## 2016-07-24 LAB — OB RESULTS CONSOLE ANTIBODY SCREEN: Antibody Screen: NEGATIVE

## 2016-07-24 LAB — OB RESULTS CONSOLE RPR: RPR: NONREACTIVE

## 2016-07-24 LAB — OB RESULTS CONSOLE GC/CHLAMYDIA
Chlamydia: NEGATIVE
Gonorrhea: NEGATIVE

## 2016-07-24 LAB — OB RESULTS CONSOLE RUBELLA ANTIBODY, IGM: Rubella: IMMUNE

## 2016-07-24 LAB — OB RESULTS CONSOLE GBS: GBS: POSITIVE

## 2016-07-24 LAB — OB RESULTS CONSOLE HIV ANTIBODY (ROUTINE TESTING): HIV: NONREACTIVE

## 2016-08-04 DIAGNOSIS — Z3480 Encounter for supervision of other normal pregnancy, unspecified trimester: Secondary | ICD-10-CM | POA: Diagnosis not present

## 2016-08-04 DIAGNOSIS — Z36 Encounter for antenatal screening for chromosomal anomalies: Secondary | ICD-10-CM | POA: Diagnosis not present

## 2016-08-04 DIAGNOSIS — Z3491 Encounter for supervision of normal pregnancy, unspecified, first trimester: Secondary | ICD-10-CM | POA: Diagnosis not present

## 2016-08-06 DIAGNOSIS — Z22330 Carrier of Group B streptococcus: Secondary | ICD-10-CM | POA: Diagnosis not present

## 2016-08-06 DIAGNOSIS — N390 Urinary tract infection, site not specified: Secondary | ICD-10-CM | POA: Diagnosis not present

## 2016-08-24 DIAGNOSIS — Z3491 Encounter for supervision of normal pregnancy, unspecified, first trimester: Secondary | ICD-10-CM | POA: Diagnosis not present

## 2016-08-24 DIAGNOSIS — Z3682 Encounter for antenatal screening for nuchal translucency: Secondary | ICD-10-CM | POA: Diagnosis not present

## 2016-08-24 DIAGNOSIS — Z36 Encounter for antenatal screening for chromosomal anomalies: Secondary | ICD-10-CM | POA: Diagnosis not present

## 2016-08-26 DIAGNOSIS — N760 Acute vaginitis: Secondary | ICD-10-CM | POA: Diagnosis not present

## 2016-09-29 DIAGNOSIS — Z3A18 18 weeks gestation of pregnancy: Secondary | ICD-10-CM | POA: Diagnosis not present

## 2016-09-29 DIAGNOSIS — Z363 Encounter for antenatal screening for malformations: Secondary | ICD-10-CM | POA: Diagnosis not present

## 2016-09-29 DIAGNOSIS — Z3480 Encounter for supervision of other normal pregnancy, unspecified trimester: Secondary | ICD-10-CM | POA: Diagnosis not present

## 2016-10-23 DIAGNOSIS — J018 Other acute sinusitis: Secondary | ICD-10-CM | POA: Diagnosis not present

## 2016-10-28 DIAGNOSIS — Z362 Encounter for other antenatal screening follow-up: Secondary | ICD-10-CM | POA: Diagnosis not present

## 2016-11-25 ENCOUNTER — Encounter: Payer: BLUE CROSS/BLUE SHIELD | Attending: Obstetrics and Gynecology | Admitting: Registered"

## 2016-11-25 DIAGNOSIS — Z713 Dietary counseling and surveillance: Secondary | ICD-10-CM | POA: Diagnosis not present

## 2016-11-25 DIAGNOSIS — O99810 Abnormal glucose complicating pregnancy: Secondary | ICD-10-CM | POA: Diagnosis not present

## 2016-11-25 DIAGNOSIS — Z3A00 Weeks of gestation of pregnancy not specified: Secondary | ICD-10-CM | POA: Diagnosis not present

## 2016-11-25 DIAGNOSIS — R7309 Other abnormal glucose: Secondary | ICD-10-CM

## 2016-11-30 ENCOUNTER — Encounter: Payer: Self-pay | Admitting: Registered"

## 2016-11-30 NOTE — Progress Notes (Signed)
Patient was seen on 11/25/2016 for Gestational Diabetes self-management class at the [facility]. The following learning objectives were met by the patient during this course:   States the definition of Gestational Diabetes  States why dietary management is important in controlling blood glucose  Describes the effects each nutrient has on blood glucose levels  Demonstrates ability to create a balanced meal plan  Demonstrates carbohydrate counting   States when to check blood glucose levels  Demonstrates proper blood glucose monitoring techniques  States the effect of stress and exercise on blood glucose levels  States the importance of limiting caffeine and abstaining from alcohol and smoking  Blood glucose monitor given: Contour Next Lot # [id number] Exp: 01/03/2017 Blood glucose reading: 84  Patient instructed to monitor glucose levels: FBS: 60 - <95 1 hour: <140 2 hour: <120  Patient received handouts:  Nutrition Diabetes and Pregnancy  Carbohydrate Counting List  Patient will be seen for follow-up as needed.

## 2016-12-03 DIAGNOSIS — Z23 Encounter for immunization: Secondary | ICD-10-CM | POA: Diagnosis not present

## 2016-12-03 DIAGNOSIS — Z3480 Encounter for supervision of other normal pregnancy, unspecified trimester: Secondary | ICD-10-CM | POA: Diagnosis not present

## 2017-01-01 DIAGNOSIS — Z3A31 31 weeks gestation of pregnancy: Secondary | ICD-10-CM | POA: Diagnosis not present

## 2017-01-01 DIAGNOSIS — O24419 Gestational diabetes mellitus in pregnancy, unspecified control: Secondary | ICD-10-CM | POA: Diagnosis not present

## 2017-01-07 DIAGNOSIS — O24419 Gestational diabetes mellitus in pregnancy, unspecified control: Secondary | ICD-10-CM | POA: Diagnosis not present

## 2017-01-07 DIAGNOSIS — Z3A32 32 weeks gestation of pregnancy: Secondary | ICD-10-CM | POA: Diagnosis not present

## 2017-01-14 DIAGNOSIS — O9989 Other specified diseases and conditions complicating pregnancy, childbirth and the puerperium: Secondary | ICD-10-CM | POA: Diagnosis not present

## 2017-01-14 DIAGNOSIS — Z3A33 33 weeks gestation of pregnancy: Secondary | ICD-10-CM | POA: Diagnosis not present

## 2017-01-18 DIAGNOSIS — O24419 Gestational diabetes mellitus in pregnancy, unspecified control: Secondary | ICD-10-CM | POA: Diagnosis not present

## 2017-01-18 DIAGNOSIS — Z3A34 34 weeks gestation of pregnancy: Secondary | ICD-10-CM | POA: Diagnosis not present

## 2017-01-21 DIAGNOSIS — O24419 Gestational diabetes mellitus in pregnancy, unspecified control: Secondary | ICD-10-CM | POA: Diagnosis not present

## 2017-01-21 DIAGNOSIS — Z3A34 34 weeks gestation of pregnancy: Secondary | ICD-10-CM | POA: Diagnosis not present

## 2017-01-25 DIAGNOSIS — Z3A35 35 weeks gestation of pregnancy: Secondary | ICD-10-CM | POA: Diagnosis not present

## 2017-01-25 DIAGNOSIS — O24419 Gestational diabetes mellitus in pregnancy, unspecified control: Secondary | ICD-10-CM | POA: Diagnosis not present

## 2017-01-27 DIAGNOSIS — R5383 Other fatigue: Secondary | ICD-10-CM | POA: Diagnosis not present

## 2017-01-27 DIAGNOSIS — Z23 Encounter for immunization: Secondary | ICD-10-CM | POA: Diagnosis not present

## 2017-01-27 DIAGNOSIS — O24419 Gestational diabetes mellitus in pregnancy, unspecified control: Secondary | ICD-10-CM | POA: Diagnosis not present

## 2017-01-27 DIAGNOSIS — E785 Hyperlipidemia, unspecified: Secondary | ICD-10-CM | POA: Diagnosis not present

## 2017-01-28 DIAGNOSIS — Z3A35 35 weeks gestation of pregnancy: Secondary | ICD-10-CM | POA: Diagnosis not present

## 2017-01-28 DIAGNOSIS — O24419 Gestational diabetes mellitus in pregnancy, unspecified control: Secondary | ICD-10-CM | POA: Diagnosis not present

## 2017-02-01 DIAGNOSIS — O24419 Gestational diabetes mellitus in pregnancy, unspecified control: Secondary | ICD-10-CM | POA: Diagnosis not present

## 2017-02-01 DIAGNOSIS — Z3A36 36 weeks gestation of pregnancy: Secondary | ICD-10-CM | POA: Diagnosis not present

## 2017-02-04 DIAGNOSIS — Z3A36 36 weeks gestation of pregnancy: Secondary | ICD-10-CM | POA: Diagnosis not present

## 2017-02-04 DIAGNOSIS — O24419 Gestational diabetes mellitus in pregnancy, unspecified control: Secondary | ICD-10-CM | POA: Diagnosis not present

## 2017-02-08 ENCOUNTER — Encounter (HOSPITAL_COMMUNITY): Payer: Self-pay | Admitting: *Deleted

## 2017-02-08 ENCOUNTER — Telehealth (HOSPITAL_COMMUNITY): Payer: Self-pay | Admitting: *Deleted

## 2017-02-08 DIAGNOSIS — O24913 Unspecified diabetes mellitus in pregnancy, third trimester: Secondary | ICD-10-CM | POA: Diagnosis not present

## 2017-02-08 DIAGNOSIS — Z3A37 37 weeks gestation of pregnancy: Secondary | ICD-10-CM | POA: Diagnosis not present

## 2017-02-08 NOTE — Telephone Encounter (Signed)
Preadmission screen  

## 2017-02-11 DIAGNOSIS — Z3A37 37 weeks gestation of pregnancy: Secondary | ICD-10-CM | POA: Diagnosis not present

## 2017-02-11 DIAGNOSIS — O9989 Other specified diseases and conditions complicating pregnancy, childbirth and the puerperium: Secondary | ICD-10-CM | POA: Diagnosis not present

## 2017-02-12 ENCOUNTER — Telehealth (HOSPITAL_COMMUNITY): Payer: Self-pay | Admitting: *Deleted

## 2017-02-12 NOTE — Telephone Encounter (Signed)
Preadmission screen  

## 2017-02-15 ENCOUNTER — Telehealth (HOSPITAL_COMMUNITY): Payer: Self-pay | Admitting: *Deleted

## 2017-02-15 ENCOUNTER — Encounter (HOSPITAL_COMMUNITY): Payer: Self-pay | Admitting: *Deleted

## 2017-02-15 DIAGNOSIS — Z3A38 38 weeks gestation of pregnancy: Secondary | ICD-10-CM | POA: Diagnosis not present

## 2017-02-15 DIAGNOSIS — O24419 Gestational diabetes mellitus in pregnancy, unspecified control: Secondary | ICD-10-CM | POA: Diagnosis not present

## 2017-02-15 NOTE — Telephone Encounter (Signed)
Preadmission screen  

## 2017-02-18 ENCOUNTER — Encounter (HOSPITAL_COMMUNITY): Payer: Self-pay | Admitting: Anesthesiology

## 2017-02-18 ENCOUNTER — Inpatient Hospital Stay (HOSPITAL_COMMUNITY): Payer: BLUE CROSS/BLUE SHIELD | Admitting: Anesthesiology

## 2017-02-18 ENCOUNTER — Inpatient Hospital Stay (HOSPITAL_COMMUNITY)
Admission: AD | Admit: 2017-02-18 | Discharge: 2017-02-20 | DRG: 807 | Disposition: A | Discharge status: Home / Self Care | Payer: BLUE CROSS/BLUE SHIELD | Source: Ambulatory Visit | Attending: Obstetrics and Gynecology | Admitting: Obstetrics and Gynecology

## 2017-02-18 ENCOUNTER — Encounter (HOSPITAL_COMMUNITY): Payer: Self-pay | Admitting: Emergency Medicine

## 2017-02-18 DIAGNOSIS — O99824 Streptococcus B carrier state complicating childbirth: Secondary | ICD-10-CM | POA: Diagnosis present

## 2017-02-18 DIAGNOSIS — F329 Major depressive disorder, single episode, unspecified: Secondary | ICD-10-CM | POA: Diagnosis present

## 2017-02-18 DIAGNOSIS — F419 Anxiety disorder, unspecified: Secondary | ICD-10-CM | POA: Diagnosis not present

## 2017-02-18 DIAGNOSIS — Z3A38 38 weeks gestation of pregnancy: Secondary | ICD-10-CM

## 2017-02-18 DIAGNOSIS — O24425 Gestational diabetes mellitus in childbirth, controlled by oral hypoglycemic drugs: Principal | ICD-10-CM | POA: Diagnosis present

## 2017-02-18 DIAGNOSIS — O99344 Other mental disorders complicating childbirth: Secondary | ICD-10-CM | POA: Diagnosis not present

## 2017-02-18 DIAGNOSIS — Z87891 Personal history of nicotine dependence: Secondary | ICD-10-CM

## 2017-02-18 DIAGNOSIS — O24429 Gestational diabetes mellitus in childbirth, unspecified control: Secondary | ICD-10-CM | POA: Diagnosis not present

## 2017-02-18 DIAGNOSIS — Z23 Encounter for immunization: Secondary | ICD-10-CM | POA: Diagnosis not present

## 2017-02-18 LAB — TYPE AND SCREEN
ABO/RH(D): A POS
Antibody Screen: NEGATIVE

## 2017-02-18 LAB — CBC
HCT: 37.2 % (ref 36.0–46.0)
Hemoglobin: 11.9 g/dL — ABNORMAL LOW (ref 12.0–15.0)
MCH: 28.8 pg (ref 26.0–34.0)
MCHC: 32 g/dL (ref 30.0–36.0)
MCV: 90.1 fL (ref 78.0–100.0)
Platelets: 187 10*3/uL (ref 150–400)
RBC: 4.13 MIL/uL (ref 3.87–5.11)
RDW: 14.1 % (ref 11.5–15.5)
WBC: 10.8 10*3/uL — ABNORMAL HIGH (ref 4.0–10.5)

## 2017-02-18 LAB — RPR: RPR Ser Ql: NONREACTIVE

## 2017-02-18 LAB — GLUCOSE, CAPILLARY: Glucose-Capillary: 100 mg/dL — ABNORMAL HIGH (ref 65–99)

## 2017-02-18 MED ORDER — CLINDAMYCIN PHOSPHATE 900 MG/50ML IV SOLN
900.0000 mg | Freq: Three times a day (TID) | INTRAVENOUS | Status: DC
  Filled 2017-02-18: qty 50

## 2017-02-18 MED ORDER — LACTATED RINGERS IV SOLN: 500.0000 mL | Freq: Once | INTRAVENOUS | Status: DC

## 2017-02-18 MED ORDER — SOD CITRATE-CITRIC ACID 500-334 MG/5ML PO SOLN: 30.0000 mL | ORAL | Status: DC | PRN

## 2017-02-18 MED ORDER — DIBUCAINE 1 % RE OINT: 1.0000 "application " | TOPICAL_OINTMENT | RECTAL | Status: DC | PRN

## 2017-02-18 MED ORDER — DIPHENHYDRAMINE HCL 50 MG/ML IJ SOLN: 12.5000 mg | INTRAMUSCULAR | Status: DC | PRN

## 2017-02-18 MED ORDER — ONDANSETRON HCL 4 MG/2ML IJ SOLN: 4.0000 mg | INTRAMUSCULAR | Status: DC | PRN

## 2017-02-18 MED ORDER — OXYTOCIN 40 UNITS IN LACTATED RINGERS INFUSION - SIMPLE MED: 2.5000 [IU]/h | INTRAVENOUS | Status: DC

## 2017-02-18 MED ORDER — LACTATED RINGERS IV SOLN: INTRAVENOUS | Status: DC

## 2017-02-18 MED ORDER — PHENYLEPHRINE 40 MCG/ML (10ML) SYRINGE FOR IV PUSH (FOR BLOOD PRESSURE SUPPORT)
80.0000 ug | PREFILLED_SYRINGE | INTRAVENOUS | Status: DC | PRN
  Filled 2017-02-18: qty 5

## 2017-02-18 MED ORDER — SERTRALINE HCL 50 MG PO TABS
50.0000 mg | ORAL_TABLET | Freq: Every day | ORAL | Status: DC
  Administered 2017-02-18 – 2017-02-19 (×2): 50 mg via ORAL
  Filled 2017-02-18 (×2): qty 1

## 2017-02-18 MED ORDER — COCONUT OIL OIL
1.0000 "application " | TOPICAL_OIL | Status: DC | PRN
  Administered 2017-02-18: 1 via TOPICAL
  Filled 2017-02-18: qty 120

## 2017-02-18 MED ORDER — ONDANSETRON HCL 4 MG PO TABS: 4.0000 mg | ORAL_TABLET | ORAL | Status: DC | PRN

## 2017-02-18 MED ORDER — EPHEDRINE 5 MG/ML INJ
10.0000 mg | INTRAVENOUS | Status: DC | PRN
  Filled 2017-02-18: qty 2

## 2017-02-18 MED ORDER — LIDOCAINE HCL (PF) 1 % IJ SOLN
INTRAMUSCULAR | Status: AC
  Filled 2017-02-18: qty 30

## 2017-02-18 MED ORDER — WITCH HAZEL-GLYCERIN EX PADS: 1.0000 "application " | MEDICATED_PAD | CUTANEOUS | Status: DC | PRN

## 2017-02-18 MED ORDER — CLINDAMYCIN PHOSPHATE 900 MG/50ML IV SOLN
900.0000 mg | Freq: Three times a day (TID) | INTRAVENOUS | Status: DC
  Filled 2017-02-18 (×2): qty 50

## 2017-02-18 MED ORDER — PRENATAL MULTIVITAMIN CH
1.0000 | ORAL_TABLET | Freq: Every day | ORAL | Status: DC
  Filled 2017-02-18: qty 1

## 2017-02-18 MED ORDER — ACETAMINOPHEN 325 MG PO TABS
650.0000 mg | ORAL_TABLET | ORAL | Status: DC | PRN
  Administered 2017-02-18 – 2017-02-20 (×7): 650 mg via ORAL
  Filled 2017-02-18 (×7): qty 2

## 2017-02-18 MED ORDER — LIDOCAINE HCL (PF) 1 % IJ SOLN
30.0000 mL | INTRAMUSCULAR | Status: DC | PRN
  Filled 2017-02-18: qty 30

## 2017-02-18 MED ORDER — FENTANYL 2.5 MCG/ML BUPIVACAINE 1/10 % EPIDURAL INFUSION (WH - ANES)
14.0000 mL/h | INTRAMUSCULAR | Status: DC | PRN
  Administered 2017-02-18: 14 mL/h via EPIDURAL

## 2017-02-18 MED ORDER — OXYCODONE-ACETAMINOPHEN 5-325 MG PO TABS: 1.0000 | ORAL_TABLET | ORAL | Status: DC | PRN

## 2017-02-18 MED ORDER — DIPHENHYDRAMINE HCL 25 MG PO CAPS: 25.0000 mg | ORAL_CAPSULE | Freq: Four times a day (QID) | ORAL | Status: DC | PRN

## 2017-02-18 MED ORDER — OXYTOCIN 40 UNITS IN LACTATED RINGERS INFUSION - SIMPLE MED
INTRAVENOUS | Status: AC
  Filled 2017-02-18: qty 1000

## 2017-02-18 MED ORDER — ACETAMINOPHEN 325 MG PO TABS: 650.0000 mg | ORAL_TABLET | ORAL | Status: DC | PRN

## 2017-02-18 MED ORDER — FENTANYL 2.5 MCG/ML BUPIVACAINE 1/10 % EPIDURAL INFUSION (WH - ANES)
INTRAMUSCULAR | Status: AC
  Filled 2017-02-18: qty 100

## 2017-02-18 MED ORDER — SENNOSIDES-DOCUSATE SODIUM 8.6-50 MG PO TABS
2.0000 | ORAL_TABLET | ORAL | Status: DC
  Administered 2017-02-19: 2 via ORAL
  Filled 2017-02-18: qty 2

## 2017-02-18 MED ORDER — OXYTOCIN BOLUS FROM INFUSION
500.0000 mL | Freq: Once | INTRAVENOUS | Status: AC
  Administered 2017-02-18: 500 mL via INTRAVENOUS

## 2017-02-18 MED ORDER — LIDOCAINE HCL (PF) 1 % IJ SOLN
INTRAMUSCULAR | Status: DC | PRN
  Administered 2017-02-18 (×2): 4 mL via EPIDURAL

## 2017-02-18 MED ORDER — TETANUS-DIPHTH-ACELL PERTUSSIS 5-2.5-18.5 LF-MCG/0.5 IM SUSP: 0.5000 mL | Freq: Once | INTRAMUSCULAR | Status: DC

## 2017-02-18 MED ORDER — FLEET ENEMA 7-19 GM/118ML RE ENEM: 1.0000 | ENEMA | RECTAL | Status: DC | PRN

## 2017-02-18 MED ORDER — PHENYLEPHRINE 40 MCG/ML (10ML) SYRINGE FOR IV PUSH (FOR BLOOD PRESSURE SUPPORT)
PREFILLED_SYRINGE | INTRAVENOUS | Status: AC
  Filled 2017-02-18: qty 10

## 2017-02-18 MED ORDER — BENZOCAINE-MENTHOL 20-0.5 % EX AERO
1.0000 | INHALATION_SPRAY | CUTANEOUS | Status: DC | PRN
Start: 2017-02-18 — End: 2017-02-20
  Administered 2017-02-18: 1 via TOPICAL
  Filled 2017-02-18: qty 56

## 2017-02-18 MED ORDER — LACTATED RINGERS IV SOLN: 500.0000 mL | INTRAVENOUS | Status: DC | PRN

## 2017-02-18 MED ORDER — SIMETHICONE 80 MG PO CHEW: 80.0000 mg | CHEWABLE_TABLET | ORAL | Status: DC | PRN

## 2017-02-18 MED ORDER — OXYCODONE-ACETAMINOPHEN 5-325 MG PO TABS: 2.0000 | ORAL_TABLET | ORAL | Status: DC | PRN

## 2017-02-18 MED ORDER — ONDANSETRON HCL 4 MG/2ML IJ SOLN: 4.0000 mg | Freq: Four times a day (QID) | INTRAMUSCULAR | Status: DC | PRN

## 2017-02-18 NOTE — Anesthesia Pain Management Evaluation Note (Signed)
  CRNA Pain Management Visit Note  Patient: Lisa Howe, 36 y.o., female  "Hello I am a member of the anesthesia team at [facility]. We have an anesthesia team available at all times to provide care throughout the hospital, including epidural management and anesthesia for C-section. I don't know your plan for the delivery whether it a natural birth, water birth, IV sedation, nitrous supplementation, doula or epidural, but we want to meet your pain goals."   1.Was your pain managed to your expectations on prior hospitalizations?   Yes   2.What is your expectation for pain management during this hospitalization?     Epidural  3.How can we help you reach that goal? epidural  Record the patient's initial score and the patient's pain goal.   Pain: 0  Pain Goal: 4 [facility] wants you to be able to say your pain was always managed very well.  Pj Zehner 02/18/2017

## 2017-02-18 NOTE — Anesthesia Procedure Notes (Signed)
Epidural Patient location during procedure: OB Start time: 02/18/2017 7:41 AM  Staffing Anesthesiologist: Murvin Natal, MD Performed: anesthesiologist   Preanesthetic Checklist Completed: patient identified, site marked, pre-op evaluation, timeout performed, IV checked, risks and benefits discussed and monitors and equipment checked  Epidural Patient position: sitting Prep: DuraPrep Patient monitoring: heart rate, cardiac monitor, continuous pulse ox and blood pressure Approach: midline Location: L4-L5 Injection technique: LOR air  Needle:  Needle type: Tuohy  Needle gauge: 17 G Needle length: 9 cm Needle insertion depth: 6 cm Catheter type: closed end flexible Catheter size: 19 Gauge Catheter at skin depth: 11 cm Test dose: negative and Other  Assessment Events: blood not aspirated, injection not painful, no injection resistance and negative IV test  Additional Notes Informed consent obtained prior to proceeding including risk of failure, 1% risk of PDPH, risk of minor discomfort and bruising. Discussed alternatives to epidural analgesia and patient desires to proceed.  Timeout performed pre-procedure verifying patient name, procedure, and platelet count.  Patient tolerated procedure well. Reason for block:procedure for pain

## 2017-02-18 NOTE — Anesthesia Preprocedure Evaluation (Signed)
Anesthesia Evaluation  Patient identified by MRN, date of birth, ID band Patient awake    Reviewed: Allergy & Precautions, H&P , NPO status , Patient's Chart, lab work & pertinent test results  History of Anesthesia Complications Negative for: history of anesthetic complications  Airway Mallampati: II  TM Distance: >3 FB Neck ROM: full    Dental no notable dental hx. (+) Teeth Intact   Pulmonary neg pulmonary ROS, former smoker,    Pulmonary exam normal breath sounds clear to auscultation       Cardiovascular negative cardio ROS Normal cardiovascular exam Rhythm:regular Rate:Normal     Neuro/Psych  Headaches, OCD (obsessive compulsive disorder)   GI/Hepatic Neg liver ROS, IBS (irritable bowel syndrome)   Endo/Other  negative endocrine ROSdiabetes, Gestational  Renal/GU negative Renal ROS     Musculoskeletal   Abdominal   Peds  Hematology negative hematology ROS (+)   Anesthesia Other Findings   Reproductive/Obstetrics (+) Pregnancy                             Anesthesia Physical Anesthesia Plan  ASA: III  Anesthesia Plan: Epidural   Post-op Pain Management:    Induction:   PONV Risk Score and Plan:   Airway Management Planned:   Additional Equipment:   Intra-op Plan:   Post-operative Plan:   Informed Consent: I have reviewed the patients History and Physical, chart, labs and discussed the procedure including the risks, benefits and alternatives for the proposed anesthesia with the patient or authorized representative who has indicated his/her understanding and acceptance.     Plan Discussed with:   Anesthesia Plan Comments:         Anesthesia Quick Evaluation

## 2017-02-18 NOTE — Progress Notes (Signed)
MOB was referred for history of depression/anxiety. * Referral screened out by Clinical Social Worker because none of the following criteria appear to apply: ~ History of anxiety/depression during this pregnancy, or of post-partum depression. ~ Diagnosis of anxiety and/or depression within last 3 years OR * MOB's symptoms currently being treated with medication and/or therapy. Please contact the Clinical Social Worker if needs arise, by [facility] request, or if MOB scores greater than 9/yes to question 10 on Edinburgh Postpartum Depression Screen.  MOB has Rx for Zoloft.

## 2017-02-18 NOTE — Anesthesia Postprocedure Evaluation (Signed)
Anesthesia Post Note  Patient: Lisa Howe  Procedure(s) Performed: AN AD [place]     Patient location during evaluation: Mother Baby Anesthesia Type: Epidural Level of consciousness: awake and alert and oriented Pain management: pain level controlled Vital Signs Assessment: post-procedure vital signs reviewed and stable Respiratory status: spontaneous breathing and nonlabored ventilation Cardiovascular status: stable Postop Assessment: no headache, adequate PO intake, no backache, epidural receding, patient able to bend at knees and no apparent nausea or vomiting Anesthetic complications: no    Last Vitals:  Vitals:   02/18/17 1220 02/18/17 1341  BP: 113/81 116/65  Pulse: 66 70  Resp: 16 20  Temp: 36.7 C 37.4 C  SpO2:      Last Pain:  Vitals:   02/18/17 1341  TempSrc: Oral  PainSc:    Pain Goal:                 [employer]

## 2017-02-18 NOTE — H&P (Addendum)
Lisa Howe is a 36 y.o. female presenting for SOL.  She just received her epidural and is comfortable.  CTX started last night and increased in intensity and frequency.  No VB or LOF and active FM.  Antepartum course complicated by Y7WG on Glyburide.  She has had good glycemic control.  Anxiety and depression stable on sertraline.  GBS positive with PCN and vanc allergies.  OB History    Gravida Para Term Preterm AB Living   2 1 1  0 0 1   SAB TAB Ectopic Multiple Live Births   0 0 0 0 1     Past Medical History:  Diagnosis Date  . Anxiety    panic attacks  . Depression    PP anxiety and depression  . Excessive daytime sleepiness   . Gestational diabetes   . HSV-1 (herpes simplex virus 1) infection   . IBS (irritable bowel syndrome)    not sure what caused it  . Migraines   . OCD (obsessive compulsive disorder)   . PCOS (polycystic ovarian syndrome)   . Vaginal Pap smear, abnormal    Past Surgical History:  Procedure Laterality Date  . TONSILLECTOMY    . wisdom teeth      Family History: family history includes Cancer in her maternal grandmother and paternal grandmother; Diabetes in her maternal grandfather and paternal grandmother; Heart disease in her maternal grandfather and paternal grandfather; Hyperlipidemia in her father, mother, and sister; Hypertension in her father and mother; Parkinson's disease in her maternal grandfather; Thyroid disease in her mother. Social History:  reports that she has quit smoking. Her smoking use included cigarettes. she has never used smokeless tobacco. She reports that she drinks alcohol. She reports that she does not use drugs.     Maternal Diabetes: Yes:  Diabetes Type:  Insulin/Medication controlled Genetic Screening: Normal Maternal Ultrasounds/Referrals: Normal Fetal Ultrasounds or other Referrals:  None Maternal Substance Abuse:  No Significant Maternal Medications:  Meds include: Zoloft Other: Glyburide Significant Maternal Lab  Results:  Lab values include: Group B Strep positive Other Comments:  None  ROS Maternal Medical History:  Reason for admission: Contractions.   Contractions: Onset was 6-12 hours ago.   Frequency: regular.   Perceived severity is moderate.    Fetal activity: Perceived fetal activity is normal.   Last perceived fetal movement was within the past hour.    Prenatal complications: no prenatal complications Prenatal Complications - Diabetes: gestational. Diabetes is managed by oral agent (monotherapy).      Dilation: 7.5 Effacement (%): 90 Station: -3 Exam by:: lauren fields  Blood pressure (!) 141/90, pulse 68, temperature 98.1 F (36.7 C), temperature source Oral, resp. rate 16, SpO2 100 %, unknown if currently breastfeeding. Maternal Exam:  Uterine Assessment: Contraction strength is moderate.  Contraction frequency is regular.   Abdomen: Patient reports no abdominal tenderness. Fundal height is c/w dates.   Estimated fetal weight is 8#.       Physical Exam  Constitutional: She is oriented to person, place, and time. She appears well-developed and well-nourished.  GI: Soft. There is no rebound and no guarding.  Neurological: She is alert and oriented to person, place, and time.  Skin: Skin is warm and dry.  Psychiatric: She has a normal mood and affect. Her behavior is normal.    Prenatal labs: ABO, Rh: --/--/A POS (11/15 0700) Antibody: PENDING (11/15 0700) Rubella: Immune (04/20 0000) RPR: Nonreactive (04/20 0000)  HBsAg: Negative (04/20 0000)  HIV: Non-reactive (04/20  0000)  GBS: Positive (04/20 0000)   Assessment/Plan: 36yo G2P1001 at [redacted]w[redacted]d with labor -Clinda for PCN ppx -AROM -Anticipate NSVD   Lisa Howe 02/18/2017, 8:25 AM

## 2017-02-18 NOTE — MAU Note (Signed)
Pt reports contractions that started at 10pm and are now every few minutes. Pt reports some spotting. Denies LOF. Reports good fetal movement. States she was 2cm on Monday.

## 2017-02-18 NOTE — Progress Notes (Signed)
I was informed that the patient was feeling pressure as I was going back for STAT C/S.  I requested that faculty be on standby.  After leaving the OR, I went directly to the patient's LR where she was comfortably holding her stable newborn.  Patient delivered by [facility], CNM without complication; 2nd degree lac repaired.  See delivery note for details.   Linda Hedges, DO

## 2017-02-19 LAB — CBC
HCT: 32.9 % — ABNORMAL LOW (ref 36.0–46.0)
Hemoglobin: 10.6 g/dL — ABNORMAL LOW (ref 12.0–15.0)
MCH: 29.2 pg (ref 26.0–34.0)
MCHC: 32.2 g/dL (ref 30.0–36.0)
MCV: 90.6 fL (ref 78.0–100.0)
Platelets: 163 10*3/uL (ref 150–400)
RBC: 3.63 MIL/uL — ABNORMAL LOW (ref 3.87–5.11)
RDW: 14.4 % (ref 11.5–15.5)
WBC: 9.4 10*3/uL (ref 4.0–10.5)

## 2017-02-19 NOTE — Lactation Note (Signed)
This note was copied from a baby's chart. Lactation Consultation Note  Patient Name: Lisa Howe OMVEH'M Date: 02/19/2017 Reason for consult: Initial assessment;Maternal endocrine disorder(history of low milk supply) Type of Endocrine Disorder?: PCOS(GDM, history of infertility with 1st child)   Initial consult with mom of 41 hour old infant. Infant with 8 BF for 12-30 minutes, formula x 6 with 5-30 minutes, 10 voids and 6 stools in last 24 hours. Infant weight 7 lb 8.1 oz with 4% weight loss since birth. LATCH scores 10. Infant asleep in dad's arms. Mom reports she is BF and bottle feeding well.   Mom reports she BF her first child and had to supplement early. Mom reports she used SNS, pumping, oatmeal, Fenugreek etc. Mom reports she wants to give formula as she did not want to get as stressed out as she did with the last child. Mom reports she needed infertility treatments with there first child. RN reports mom has tubular shaped breasts, LC was not able to assess breasts at this time.   Mom is aware to BF prior to offering formula. Mom is pumping to stimulate milk production. Mom requested pump this morning and it was set up for her. Mom has a Medela PIS for hoe use.   BF Resources Handout and [facility] Brochure given, mom informed of IP/OP Services, BF Support Groups and [place] phone #. Mom with no questions/concerns about BF at this time. Mom wants to give as much BM as possible and supplement with formula.      Maternal Data Formula Feeding for Exclusion: Yes Reason for exclusion: Mother's choice to formula and breast feed on admission Has patient been taught Hand Expression?: Yes Does the patient have breastfeeding experience prior to this delivery?: Yes  Feeding Feeding Type: Formula Nipple Type: Slow - flow Length of feed: 20 min  LATCH Score                   Interventions    Lactation Tools Discussed/Used WIC Program: No Pump Review: Setup, frequency, and  cleaning;Milk Storage Initiated by:: Reviewed and encouraged about every 3 hours post BF   Consult Status Consult Status: Follow-up Date: 02/20/17 Follow-up type: In-patient    Debby Freiberg Chari Parmenter 02/19/2017, 7:14 PM

## 2017-02-20 MED ORDER — ACETAMINOPHEN 325 MG PO TABS: 650.0000 mg | ORAL_TABLET | Freq: Four times a day (QID) | ORAL | 0 refills | Status: DC | PRN

## 2017-02-20 NOTE — Discharge Summary (Signed)
Obstetric Discharge Summary Reason for Admission: onset of labor Prenatal Procedures: none Intrapartum Procedures: spontaneous vaginal delivery Postpartum Procedures: none Complications-Operative and Postpartum: none Hemoglobin  Date Value Ref Range Status  02/19/2017 10.6 (L) 12.0 - 15.0 g/dL Final   Hemoglobin, fingerstick  Date Value Ref Range Status  07/18/2012 13.6 12.0 - 16.0 g/dL Final   HCT  Date Value Ref Range Status  02/19/2017 32.9 (L) 36.0 - 46.0 % Final    Physical Exam:  General: alert, cooperative and no distress Lochia: appropriate Uterine Fundus: firm Incision: healing well DVT Evaluation: No evidence of DVT seen on physical exam.  Discharge Diagnoses: Term Pregnancy-delivered  Discharge Information: Date: 02/20/2017 Activity: pelvic rest Diet: routine Medications: PNV Condition: stable Instructions: refer to practice specific booklet Discharge to: home   Newborn Data: Live born female  Birth Weight: 7 lb 13 oz (3545 g) APGAR: 8, 9  Newborn Delivery   Birth date/time:  02/18/2017 10:22:00 Delivery type:  Vaginal, Spontaneous     Home with mother.  Shon Millet II 02/20/2017, 6:10 AM

## 2017-02-22 ENCOUNTER — Inpatient Hospital Stay (HOSPITAL_COMMUNITY): Payer: BLUE CROSS/BLUE SHIELD

## 2017-03-25 DIAGNOSIS — Z1389 Encounter for screening for other disorder: Secondary | ICD-10-CM | POA: Diagnosis not present

## 2017-04-20 DIAGNOSIS — Z3202 Encounter for pregnancy test, result negative: Secondary | ICD-10-CM | POA: Diagnosis not present

## 2017-04-20 DIAGNOSIS — Z30430 Encounter for insertion of intrauterine contraceptive device: Secondary | ICD-10-CM | POA: Diagnosis not present

## 2017-05-05 DIAGNOSIS — O24419 Gestational diabetes mellitus in pregnancy, unspecified control: Secondary | ICD-10-CM | POA: Diagnosis not present

## 2017-05-05 DIAGNOSIS — Z0000 Encounter for general adult medical examination without abnormal findings: Secondary | ICD-10-CM | POA: Diagnosis not present

## 2017-05-05 DIAGNOSIS — E669 Obesity, unspecified: Secondary | ICD-10-CM | POA: Diagnosis not present

## 2017-05-05 DIAGNOSIS — E785 Hyperlipidemia, unspecified: Secondary | ICD-10-CM | POA: Diagnosis not present

## 2017-05-05 DIAGNOSIS — R5383 Other fatigue: Secondary | ICD-10-CM | POA: Diagnosis not present

## 2017-05-05 DIAGNOSIS — Z0389 Encounter for observation for other suspected diseases and conditions ruled out: Secondary | ICD-10-CM | POA: Diagnosis not present

## 2017-05-05 DIAGNOSIS — F329 Major depressive disorder, single episode, unspecified: Secondary | ICD-10-CM | POA: Diagnosis not present

## 2017-05-17 DIAGNOSIS — M79603 Pain in arm, unspecified: Secondary | ICD-10-CM | POA: Diagnosis not present

## 2017-06-01 DIAGNOSIS — Z30431 Encounter for routine checking of intrauterine contraceptive device: Secondary | ICD-10-CM | POA: Diagnosis not present

## 2017-07-13 DIAGNOSIS — R5383 Other fatigue: Secondary | ICD-10-CM | POA: Diagnosis not present

## 2017-07-13 DIAGNOSIS — E669 Obesity, unspecified: Secondary | ICD-10-CM | POA: Diagnosis not present

## 2017-07-13 DIAGNOSIS — O24419 Gestational diabetes mellitus in pregnancy, unspecified control: Secondary | ICD-10-CM | POA: Diagnosis not present

## 2017-07-13 DIAGNOSIS — R739 Hyperglycemia, unspecified: Secondary | ICD-10-CM | POA: Diagnosis not present

## 2017-07-13 DIAGNOSIS — E282 Polycystic ovarian syndrome: Secondary | ICD-10-CM | POA: Diagnosis not present

## 2017-07-13 DIAGNOSIS — G5601 Carpal tunnel syndrome, right upper limb: Secondary | ICD-10-CM | POA: Diagnosis not present

## 2017-08-12 DIAGNOSIS — E039 Hypothyroidism, unspecified: Secondary | ICD-10-CM | POA: Diagnosis not present

## 2017-08-12 DIAGNOSIS — G5621 Lesion of ulnar nerve, right upper limb: Secondary | ICD-10-CM | POA: Diagnosis not present

## 2017-08-12 DIAGNOSIS — E282 Polycystic ovarian syndrome: Secondary | ICD-10-CM | POA: Diagnosis not present

## 2017-08-12 DIAGNOSIS — G5601 Carpal tunnel syndrome, right upper limb: Secondary | ICD-10-CM | POA: Diagnosis not present

## 2017-08-16 DIAGNOSIS — E669 Obesity, unspecified: Secondary | ICD-10-CM | POA: Diagnosis not present

## 2017-08-16 DIAGNOSIS — R5383 Other fatigue: Secondary | ICD-10-CM | POA: Diagnosis not present

## 2017-08-16 DIAGNOSIS — E282 Polycystic ovarian syndrome: Secondary | ICD-10-CM | POA: Diagnosis not present

## 2017-09-02 DIAGNOSIS — G5601 Carpal tunnel syndrome, right upper limb: Secondary | ICD-10-CM | POA: Diagnosis not present

## 2017-09-02 DIAGNOSIS — G5602 Carpal tunnel syndrome, left upper limb: Secondary | ICD-10-CM | POA: Diagnosis not present

## 2017-09-23 DIAGNOSIS — R739 Hyperglycemia, unspecified: Secondary | ICD-10-CM | POA: Diagnosis not present

## 2017-09-23 DIAGNOSIS — E669 Obesity, unspecified: Secondary | ICD-10-CM | POA: Diagnosis not present

## 2017-09-23 DIAGNOSIS — R5383 Other fatigue: Secondary | ICD-10-CM | POA: Diagnosis not present

## 2017-09-23 DIAGNOSIS — L0390 Cellulitis, unspecified: Secondary | ICD-10-CM | POA: Diagnosis not present

## 2017-09-23 DIAGNOSIS — E282 Polycystic ovarian syndrome: Secondary | ICD-10-CM | POA: Diagnosis not present

## 2017-11-12 DIAGNOSIS — G5601 Carpal tunnel syndrome, right upper limb: Secondary | ICD-10-CM | POA: Diagnosis not present

## 2017-11-12 DIAGNOSIS — E039 Hypothyroidism, unspecified: Secondary | ICD-10-CM | POA: Diagnosis not present

## 2017-11-12 DIAGNOSIS — R208 Other disturbances of skin sensation: Secondary | ICD-10-CM | POA: Diagnosis not present

## 2017-11-12 DIAGNOSIS — E282 Polycystic ovarian syndrome: Secondary | ICD-10-CM | POA: Diagnosis not present

## 2017-12-21 DIAGNOSIS — N760 Acute vaginitis: Secondary | ICD-10-CM | POA: Diagnosis not present

## 2017-12-21 DIAGNOSIS — E669 Obesity, unspecified: Secondary | ICD-10-CM | POA: Diagnosis not present

## 2017-12-21 DIAGNOSIS — R102 Pelvic and perineal pain: Secondary | ICD-10-CM | POA: Diagnosis not present

## 2017-12-21 DIAGNOSIS — E559 Vitamin D deficiency, unspecified: Secondary | ICD-10-CM | POA: Diagnosis not present

## 2017-12-21 DIAGNOSIS — Z23 Encounter for immunization: Secondary | ICD-10-CM | POA: Diagnosis not present

## 2017-12-21 DIAGNOSIS — R5383 Other fatigue: Secondary | ICD-10-CM | POA: Diagnosis not present

## 2017-12-21 DIAGNOSIS — R21 Rash and other nonspecific skin eruption: Secondary | ICD-10-CM | POA: Diagnosis not present

## 2017-12-21 DIAGNOSIS — E282 Polycystic ovarian syndrome: Secondary | ICD-10-CM | POA: Diagnosis not present

## 2018-01-19 DIAGNOSIS — E669 Obesity, unspecified: Secondary | ICD-10-CM | POA: Diagnosis not present

## 2018-01-19 DIAGNOSIS — R7303 Prediabetes: Secondary | ICD-10-CM | POA: Diagnosis not present

## 2018-01-19 DIAGNOSIS — F419 Anxiety disorder, unspecified: Secondary | ICD-10-CM | POA: Diagnosis not present

## 2018-01-19 DIAGNOSIS — R21 Rash and other nonspecific skin eruption: Secondary | ICD-10-CM | POA: Diagnosis not present

## 2018-02-03 DIAGNOSIS — B373 Candidiasis of vulva and vagina: Secondary | ICD-10-CM | POA: Diagnosis not present

## 2018-02-03 DIAGNOSIS — N390 Urinary tract infection, site not specified: Secondary | ICD-10-CM | POA: Diagnosis not present

## 2018-02-03 DIAGNOSIS — H6501 Acute serous otitis media, right ear: Secondary | ICD-10-CM | POA: Diagnosis not present

## 2018-02-03 DIAGNOSIS — H6692 Otitis media, unspecified, left ear: Secondary | ICD-10-CM | POA: Diagnosis not present

## 2018-04-07 DIAGNOSIS — J019 Acute sinusitis, unspecified: Secondary | ICD-10-CM | POA: Diagnosis not present

## 2018-04-07 DIAGNOSIS — J209 Acute bronchitis, unspecified: Secondary | ICD-10-CM | POA: Diagnosis not present

## 2018-04-08 DIAGNOSIS — M25531 Pain in right wrist: Secondary | ICD-10-CM | POA: Diagnosis not present

## 2018-04-08 DIAGNOSIS — G5601 Carpal tunnel syndrome, right upper limb: Secondary | ICD-10-CM | POA: Diagnosis not present

## 2018-05-17 DIAGNOSIS — E559 Vitamin D deficiency, unspecified: Secondary | ICD-10-CM | POA: Diagnosis not present

## 2018-05-17 DIAGNOSIS — E785 Hyperlipidemia, unspecified: Secondary | ICD-10-CM | POA: Diagnosis not present

## 2018-05-17 DIAGNOSIS — R7303 Prediabetes: Secondary | ICD-10-CM | POA: Diagnosis not present

## 2018-05-17 DIAGNOSIS — G5601 Carpal tunnel syndrome, right upper limb: Secondary | ICD-10-CM | POA: Diagnosis not present

## 2018-05-17 DIAGNOSIS — Z13220 Encounter for screening for lipoid disorders: Secondary | ICD-10-CM | POA: Diagnosis not present

## 2018-05-17 DIAGNOSIS — Z0000 Encounter for general adult medical examination without abnormal findings: Secondary | ICD-10-CM | POA: Diagnosis not present

## 2018-05-17 DIAGNOSIS — R5383 Other fatigue: Secondary | ICD-10-CM | POA: Diagnosis not present

## 2018-05-17 DIAGNOSIS — F419 Anxiety disorder, unspecified: Secondary | ICD-10-CM | POA: Diagnosis not present

## 2018-05-19 DIAGNOSIS — Z6834 Body mass index (BMI) 34.0-34.9, adult: Secondary | ICD-10-CM | POA: Diagnosis not present

## 2018-05-19 DIAGNOSIS — Z01419 Encounter for gynecological examination (general) (routine) without abnormal findings: Secondary | ICD-10-CM | POA: Diagnosis not present

## 2018-05-23 LAB — HM PAP SMEAR: HPV, high-risk: NEGATIVE

## 2018-06-01 DIAGNOSIS — F411 Generalized anxiety disorder: Secondary | ICD-10-CM | POA: Diagnosis not present

## 2018-06-21 DIAGNOSIS — F411 Generalized anxiety disorder: Secondary | ICD-10-CM | POA: Diagnosis not present

## 2018-07-14 DIAGNOSIS — J0100 Acute maxillary sinusitis, unspecified: Secondary | ICD-10-CM | POA: Diagnosis not present

## 2018-07-28 DIAGNOSIS — J069 Acute upper respiratory infection, unspecified: Secondary | ICD-10-CM | POA: Diagnosis not present

## 2018-07-29 DIAGNOSIS — J209 Acute bronchitis, unspecified: Secondary | ICD-10-CM | POA: Diagnosis not present

## 2018-07-29 DIAGNOSIS — J019 Acute sinusitis, unspecified: Secondary | ICD-10-CM | POA: Diagnosis not present

## 2018-08-09 ENCOUNTER — Ambulatory Visit (INDEPENDENT_AMBULATORY_CARE_PROVIDER_SITE_OTHER): Payer: BLUE CROSS/BLUE SHIELD | Admitting: Nurse Practitioner

## 2018-08-16 DIAGNOSIS — E669 Obesity, unspecified: Secondary | ICD-10-CM | POA: Diagnosis not present

## 2018-08-16 DIAGNOSIS — J209 Acute bronchitis, unspecified: Secondary | ICD-10-CM | POA: Diagnosis not present

## 2018-08-16 DIAGNOSIS — R7303 Prediabetes: Secondary | ICD-10-CM | POA: Diagnosis not present

## 2018-08-16 DIAGNOSIS — F329 Major depressive disorder, single episode, unspecified: Secondary | ICD-10-CM | POA: Diagnosis not present

## 2018-09-06 DIAGNOSIS — F411 Generalized anxiety disorder: Secondary | ICD-10-CM | POA: Diagnosis not present

## 2018-09-14 ENCOUNTER — Other Ambulatory Visit (HOSPITAL_COMMUNITY): Payer: Self-pay | Admitting: Neurology

## 2018-09-14 ENCOUNTER — Ambulatory Visit (HOSPITAL_COMMUNITY)
Admission: RE | Admit: 2018-09-14 | Discharge: 2018-09-14 | Disposition: A | Discharge status: Home / Self Care | Payer: BC Managed Care – PPO | Source: Ambulatory Visit | Attending: Neurology | Admitting: Neurology

## 2018-09-14 ENCOUNTER — Other Ambulatory Visit: Payer: Self-pay

## 2018-09-14 DIAGNOSIS — M542 Cervicalgia: Secondary | ICD-10-CM

## 2018-09-26 DIAGNOSIS — M542 Cervicalgia: Secondary | ICD-10-CM | POA: Diagnosis not present

## 2018-09-26 DIAGNOSIS — G5621 Lesion of ulnar nerve, right upper limb: Secondary | ICD-10-CM | POA: Diagnosis not present

## 2018-09-26 DIAGNOSIS — G5601 Carpal tunnel syndrome, right upper limb: Secondary | ICD-10-CM | POA: Diagnosis not present

## 2018-09-26 DIAGNOSIS — E282 Polycystic ovarian syndrome: Secondary | ICD-10-CM | POA: Diagnosis not present

## 2018-10-12 DIAGNOSIS — R300 Dysuria: Secondary | ICD-10-CM | POA: Diagnosis not present

## 2018-10-12 DIAGNOSIS — B373 Candidiasis of vulva and vagina: Secondary | ICD-10-CM | POA: Diagnosis not present

## 2018-11-08 DIAGNOSIS — F331 Major depressive disorder, recurrent, moderate: Secondary | ICD-10-CM | POA: Diagnosis not present

## 2018-11-13 DIAGNOSIS — N760 Acute vaginitis: Secondary | ICD-10-CM | POA: Diagnosis not present

## 2018-11-16 DIAGNOSIS — N390 Urinary tract infection, site not specified: Secondary | ICD-10-CM | POA: Diagnosis not present

## 2018-12-10 DIAGNOSIS — N760 Acute vaginitis: Secondary | ICD-10-CM | POA: Diagnosis not present

## 2018-12-19 DIAGNOSIS — J019 Acute sinusitis, unspecified: Secondary | ICD-10-CM | POA: Diagnosis not present

## 2018-12-27 DIAGNOSIS — Z113 Encounter for screening for infections with a predominantly sexual mode of transmission: Secondary | ICD-10-CM | POA: Diagnosis not present

## 2018-12-27 DIAGNOSIS — E039 Hypothyroidism, unspecified: Secondary | ICD-10-CM | POA: Insufficient documentation

## 2018-12-27 DIAGNOSIS — N760 Acute vaginitis: Secondary | ICD-10-CM | POA: Diagnosis not present

## 2018-12-27 DIAGNOSIS — E119 Type 2 diabetes mellitus without complications: Secondary | ICD-10-CM | POA: Insufficient documentation

## 2018-12-27 DIAGNOSIS — K589 Irritable bowel syndrome without diarrhea: Secondary | ICD-10-CM | POA: Insufficient documentation

## 2018-12-28 ENCOUNTER — Other Ambulatory Visit (INDEPENDENT_AMBULATORY_CARE_PROVIDER_SITE_OTHER): Payer: Self-pay | Admitting: Internal Medicine

## 2018-12-28 ENCOUNTER — Telehealth (INDEPENDENT_AMBULATORY_CARE_PROVIDER_SITE_OTHER): Payer: Self-pay

## 2018-12-28 NOTE — Telephone Encounter (Signed)
I set a appt for her in oct 29 with sarah.

## 2018-12-28 NOTE — Telephone Encounter (Signed)
Please tell the patient that we have not seen her for several months and she needs to make an appointment first before I can prescribe medications.

## 2019-01-03 ENCOUNTER — Telehealth (INDEPENDENT_AMBULATORY_CARE_PROVIDER_SITE_OTHER): Payer: Self-pay | Admitting: Internal Medicine

## 2019-01-03 ENCOUNTER — Other Ambulatory Visit (INDEPENDENT_AMBULATORY_CARE_PROVIDER_SITE_OTHER): Payer: Self-pay | Admitting: Internal Medicine

## 2019-01-03 MED ORDER — SPIRONOLACTONE 50 MG PO TABS: 50.0000 mg | ORAL_TABLET | Freq: Every day | ORAL | 1 refills | Status: DC

## 2019-01-03 MED ORDER — NP THYROID 120 MG PO TABS: 120.0000 mg | ORAL_TABLET | Freq: Every day | ORAL | 1 refills | Status: DC

## 2019-01-03 NOTE — Telephone Encounter (Signed)
Done

## 2019-01-10 ENCOUNTER — Telehealth (INDEPENDENT_AMBULATORY_CARE_PROVIDER_SITE_OTHER): Payer: Self-pay

## 2019-01-10 NOTE — Telephone Encounter (Signed)
Okay, thanks

## 2019-01-10 NOTE — Telephone Encounter (Signed)
FYI Pt called and is having what she thinks is diabetic issues.  She had an appt later this month with Judson Roch but feels she needs to be seen sooner.  She is having blurred vision and dizzy.  I have moved her appt up to tomorrow at 4:20 since you had a cancellation earlier today.

## 2019-01-11 ENCOUNTER — Ambulatory Visit (INDEPENDENT_AMBULATORY_CARE_PROVIDER_SITE_OTHER): Payer: BC Managed Care – PPO | Admitting: Internal Medicine

## 2019-01-11 ENCOUNTER — Encounter (INDEPENDENT_AMBULATORY_CARE_PROVIDER_SITE_OTHER): Payer: Self-pay | Admitting: Internal Medicine

## 2019-01-11 ENCOUNTER — Other Ambulatory Visit: Payer: Self-pay

## 2019-01-11 VITALS — BP 140/80 | Ht 66.0 in | Wt 228.2 lb

## 2019-01-11 DIAGNOSIS — R5381 Other malaise: Secondary | ICD-10-CM

## 2019-01-11 DIAGNOSIS — H538 Other visual disturbances: Secondary | ICD-10-CM

## 2019-01-11 DIAGNOSIS — R739 Hyperglycemia, unspecified: Secondary | ICD-10-CM | POA: Diagnosis not present

## 2019-01-11 DIAGNOSIS — R631 Polydipsia: Secondary | ICD-10-CM | POA: Diagnosis not present

## 2019-01-11 DIAGNOSIS — E669 Obesity, unspecified: Secondary | ICD-10-CM | POA: Diagnosis not present

## 2019-01-11 DIAGNOSIS — R5383 Other fatigue: Secondary | ICD-10-CM | POA: Diagnosis not present

## 2019-01-11 DIAGNOSIS — E282 Polycystic ovarian syndrome: Secondary | ICD-10-CM | POA: Insufficient documentation

## 2019-01-11 DIAGNOSIS — Z6839 Body mass index (BMI) 39.0-39.9, adult: Secondary | ICD-10-CM | POA: Insufficient documentation

## 2019-01-11 DIAGNOSIS — O24419 Gestational diabetes mellitus in pregnancy, unspecified control: Secondary | ICD-10-CM | POA: Diagnosis not present

## 2019-01-11 HISTORY — DX: Obesity, unspecified: E66.9

## 2019-01-11 NOTE — Progress Notes (Signed)
Wellness Office Visit  Subjective:  Patient ID: Lisa Howe, female    DOB: [birth year]  Age: 38 y.o. MRN: [id number]  CC: This lady comes in for follow-up of her chronic medical problems including PCOS, obesity, vitamin D deficiency. HPI  More recently over the last several weeks, she has had symptoms that are concerning her.  She did have a history of gestational diabetes and now she is complaining of polydipsia, blurred vision, feeling fatigued constantly.  She is now concerned about diabetes.  The last time we checked her for diabetes, her hemoglobin A1c was completely normal.  When she did check her home blood sugar levels, couple of them were over 130. Past Medical History:  Diagnosis Date  . Anxiety    panic attacks  . Depression    PP anxiety and depression  . Excessive daytime sleepiness   . Gestational diabetes   . HSV-1 (herpes simplex virus 1) infection   . IBS (irritable bowel syndrome)    not sure what caused it  . Migraines   . Obesity (BMI 30-39.9) 01/11/2019  . OCD (obsessive compulsive disorder)   . PCOS (polycystic ovarian syndrome)   . Vaginal Pap smear, abnormal       Family History  Problem Relation Age of Onset  . Hypertension Mother   . Hyperlipidemia Mother   . Thyroid disease Mother   . Hypertension Father   . Hyperlipidemia Father   . Hyperlipidemia Sister   . Cancer Maternal Grandmother        melanoma  . Cancer Paternal Grandmother        liver  . Diabetes Paternal Grandmother   . Parkinson's disease Maternal Grandfather   . Heart disease Maternal Grandfather   . Diabetes Maternal Grandfather   . 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     Current Meds  Medication Sig  . Cholecalciferol (VITAMIN D-3) 125 MCG (5000 UT) TABS Take 2 tablets by mouth daily.  . drospirenone-ethinyl estradiol (YAZ) 3-0.02 MG  tablet Take 1 tablet by mouth daily.  . metFORMIN (GLUCOPHAGE-XR) 500 MG 24 hr tablet Take 1,000 mg by mouth 2 (two) times daily.  . NP THYROID 120 MG tablet Take 1 tablet (120 mg total) by mouth daily.  Marland Kitchen spironolactone (ALDACTONE) 50 MG tablet Take 1 tablet (50 mg total) by mouth daily.  . [DISCONTINUED] acetaminophen (TYLENOL) 325 MG tablet Take 2 tablets (650 mg total) every 6 (six) hours as needed by mouth (for pain scale < 4).  . [DISCONTINUED] Prenatal Vit-Fe Fumarate-FA (PRENATAL MULTIVITAMIN) TABS tablet Take 1 tablet at bedtime by mouth.      Nutrition  Inconsistent and not as we had discussed previously.  She has had a lot of stress at work. Sleep  Variable.  Exercise  None regular. Bio Identical Hormones  This patient is being treated with desiccated thyroid, off label, for symptoms of thyroid deficiency.  The patient has been counseled regarding side effects and how to deal with them.  Objective:   Today's Vitals: BP 140/80 (BP Location: Right Arm, Patient Position: Sitting, Cuff Size: Large)   Ht 5\' 6"  (1.676 m)   Wt 228 lb 3.2 oz (103.5 kg)   BMI 36.83 kg/m  Vitals with BMI 01/11/2019 02/20/2017 02/19/2017  Height 5\' 6"  - -  Weight 228 lbs 3 oz - -  BMI 36.85 - -  Systolic [id number]  Diastolic 80 83 81  Pulse - 54 67     Physical Exam   She look systemically well and remains obese.  She actually has gained about 4 pounds in weight since last time I saw her in the office.  Blood pressure is elevated systolically.    Assessment   1. Malaise and fatigue   2. PCOS (polycystic ovarian syndrome)   3. Obesity (BMI 30-39.9)   4. Polydipsia   5. Hyperglycemia   6. Blurred vision, bilateral       Tests ordered Orders Placed This Encounter  Procedures  . CBC  . COMPLETE METABOLIC PANEL WITH GFR  . Hemoglobin A1c  . T3, free  . TSH     Plan: 1. Blood work is ordered as above to see if she does have diabetes.  Also we will check her thyroid as  she is on desiccated thyroid, off label, for symptoms of thyroid deficiency. 2. In terms of the PCOS, she has not been consistent with nutrition and I have given her information regarding this again so that hopefully she will get back on track. 3. Further recommendations will depend on blood results and I will see her in about 3 months time for annual physical exam. 4. Today I spent 25 to 30 minutes with this patient face-to-face, more than 50% of the time was involved in discussing her symptoms and discussing nutrition.     Doree Albee, MD

## 2019-01-11 NOTE — Patient Instructions (Signed)

## 2019-01-12 ENCOUNTER — Other Ambulatory Visit (INDEPENDENT_AMBULATORY_CARE_PROVIDER_SITE_OTHER): Payer: Self-pay | Admitting: Internal Medicine

## 2019-01-12 LAB — COMPLETE METABOLIC PANEL WITH GFR
AG Ratio: 1.3 (calc) (ref 1.0–2.5)
ALT: 9 U/L (ref 6–29)
AST: 11 U/L (ref 10–30)
Albumin: 4.2 g/dL (ref 3.6–5.1)
Alkaline phosphatase (APISO): 50 U/L (ref 31–125)
BUN: 16 mg/dL (ref 7–25)
CO2: 25 mmol/L (ref 20–32)
Calcium: 9.7 mg/dL (ref 8.6–10.2)
Chloride: 104 mmol/L (ref 98–110)
Creat: 0.9 mg/dL (ref 0.50–1.10)
GFR, Est African American: 95 mL/min/{1.73_m2} (ref >=60)
GFR, Est Non African American: 82 mL/min/{1.73_m2} (ref >=60)
Globulin: 3.3 g/dL (calc) (ref 1.9–3.7)
Glucose, Bld: 93 mg/dL (ref 65–99)
Potassium: 4.7 mmol/L (ref 3.5–5.3)
Sodium: 139 mmol/L (ref 135–146)
Total Bilirubin: 0.2 mg/dL (ref 0.2–1.2)
Total Protein: 7.5 g/dL (ref 6.1–8.1)

## 2019-01-12 LAB — CBC
HCT: 38 % (ref 35.0–45.0)
Hemoglobin: 12.6 g/dL (ref 11.7–15.5)
MCH: 28 pg (ref 27.0–33.0)
MCHC: 33.2 g/dL (ref 32.0–36.0)
MCV: 84.4 fL (ref 80.0–100.0)
MPV: 11.7 fL (ref 7.5–12.5)
Platelets: 299 10*3/uL (ref 140–400)
RBC: 4.5 10*6/uL (ref 3.80–5.10)
RDW: 13 % (ref 11.0–15.0)
WBC: 9.5 10*3/uL (ref 3.8–10.8)

## 2019-01-12 LAB — TSH: TSH: 0.94 mIU/L

## 2019-01-12 LAB — HEMOGLOBIN A1C
Hgb A1c MFr Bld: 5.6 % of total Hgb (ref <5.7)
Mean Plasma Glucose: 114 (calc)
eAG (mmol/L): 6.3 (calc)

## 2019-01-12 LAB — T3, FREE: T3, Free: 2.7 pg/mL (ref 2.3–4.2)

## 2019-01-12 MED ORDER — THYROID 30 MG PO TABS: 30.0000 mg | ORAL_TABLET | Freq: Every day | ORAL | 3 refills | Status: DC

## 2019-01-31 ENCOUNTER — Other Ambulatory Visit (INDEPENDENT_AMBULATORY_CARE_PROVIDER_SITE_OTHER): Payer: Self-pay | Admitting: Internal Medicine

## 2019-02-02 ENCOUNTER — Ambulatory Visit (INDEPENDENT_AMBULATORY_CARE_PROVIDER_SITE_OTHER): Payer: BC Managed Care – PPO | Admitting: Nurse Practitioner

## 2019-03-07 ENCOUNTER — Other Ambulatory Visit (INDEPENDENT_AMBULATORY_CARE_PROVIDER_SITE_OTHER): Payer: Self-pay | Admitting: Internal Medicine

## 2019-03-15 DIAGNOSIS — F411 Generalized anxiety disorder: Secondary | ICD-10-CM | POA: Diagnosis not present

## 2019-03-29 ENCOUNTER — Other Ambulatory Visit: Payer: BC Managed Care – PPO

## 2019-04-17 DIAGNOSIS — M5010 Cervical disc disorder with radiculopathy, unspecified cervical region: Secondary | ICD-10-CM | POA: Diagnosis not present

## 2019-04-17 DIAGNOSIS — M542 Cervicalgia: Secondary | ICD-10-CM | POA: Diagnosis not present

## 2019-04-17 DIAGNOSIS — E282 Polycystic ovarian syndrome: Secondary | ICD-10-CM | POA: Diagnosis not present

## 2019-04-17 DIAGNOSIS — G5601 Carpal tunnel syndrome, right upper limb: Secondary | ICD-10-CM | POA: Diagnosis not present

## 2019-04-24 ENCOUNTER — Other Ambulatory Visit (HOSPITAL_COMMUNITY): Payer: Self-pay | Admitting: Neurology

## 2019-04-24 ENCOUNTER — Other Ambulatory Visit: Payer: Self-pay | Admitting: Neurology

## 2019-04-24 DIAGNOSIS — M5412 Radiculopathy, cervical region: Secondary | ICD-10-CM

## 2019-04-28 DIAGNOSIS — J019 Acute sinusitis, unspecified: Secondary | ICD-10-CM | POA: Diagnosis not present

## 2019-05-08 ENCOUNTER — Other Ambulatory Visit (INDEPENDENT_AMBULATORY_CARE_PROVIDER_SITE_OTHER): Payer: Self-pay | Admitting: Internal Medicine

## 2019-05-09 ENCOUNTER — Ambulatory Visit (HOSPITAL_COMMUNITY): Payer: BC Managed Care – PPO

## 2019-05-23 ENCOUNTER — Ambulatory Visit (HOSPITAL_COMMUNITY): Payer: BC Managed Care – PPO

## 2019-05-23 ENCOUNTER — Encounter (INDEPENDENT_AMBULATORY_CARE_PROVIDER_SITE_OTHER): Payer: BC Managed Care – PPO | Admitting: Internal Medicine

## 2019-05-23 ENCOUNTER — Encounter (HOSPITAL_COMMUNITY): Payer: Self-pay

## 2019-05-25 ENCOUNTER — Encounter (INDEPENDENT_AMBULATORY_CARE_PROVIDER_SITE_OTHER): Payer: Self-pay | Admitting: Nurse Practitioner

## 2019-06-07 ENCOUNTER — Other Ambulatory Visit (INDEPENDENT_AMBULATORY_CARE_PROVIDER_SITE_OTHER): Payer: Self-pay | Admitting: Internal Medicine

## 2019-06-13 DIAGNOSIS — M5410 Radiculopathy, site unspecified: Secondary | ICD-10-CM | POA: Diagnosis not present

## 2019-06-14 DIAGNOSIS — F411 Generalized anxiety disorder: Secondary | ICD-10-CM | POA: Diagnosis not present

## 2019-06-24 ENCOUNTER — Ambulatory Visit: Payer: BC Managed Care – PPO | Attending: Internal Medicine

## 2019-06-24 DIAGNOSIS — Z23 Encounter for immunization: Secondary | ICD-10-CM

## 2019-06-24 NOTE — Progress Notes (Signed)
   Covid-19 Vaccination Clinic  Name:  Lisa Howe    MRN: [id number] DOB: November 13, 1980  06/24/2019  [name] was observed post Covid-19 immunization for 15 minutes without incident. She was provided with Vaccine Information Sheet and instruction to access the V-Safe system.   [name] was instructed to call 911 with any severe reactions post vaccine: Marland Kitchen Difficulty breathing  . Swelling of face and throat  . A fast heartbeat  . A bad rash all over body  . Dizziness and weakness   Immunizations Administered    Name Date Dose VIS Date Route   Moderna COVID-19 Vaccine 06/24/2019 11:08 AM 0.5 mL 03/07/2019 Intramuscular   Manufacturer: Moderna   Lot: [id number]   [id number]

## 2019-07-05 ENCOUNTER — Encounter (INDEPENDENT_AMBULATORY_CARE_PROVIDER_SITE_OTHER): Payer: BC Managed Care – PPO | Admitting: Nurse Practitioner

## 2019-07-07 ENCOUNTER — Other Ambulatory Visit (INDEPENDENT_AMBULATORY_CARE_PROVIDER_SITE_OTHER): Payer: Self-pay | Admitting: Internal Medicine

## 2019-07-12 ENCOUNTER — Encounter (INDEPENDENT_AMBULATORY_CARE_PROVIDER_SITE_OTHER): Payer: Self-pay | Admitting: Nurse Practitioner

## 2019-07-12 ENCOUNTER — Other Ambulatory Visit: Payer: Self-pay

## 2019-07-12 ENCOUNTER — Ambulatory Visit (INDEPENDENT_AMBULATORY_CARE_PROVIDER_SITE_OTHER): Payer: BC Managed Care – PPO | Admitting: Nurse Practitioner

## 2019-07-12 VITALS — BP 135/80 | HR 102 | Temp 97.7°F | Ht 66.5 in | Wt 245.2 lb

## 2019-07-12 DIAGNOSIS — R5383 Other fatigue: Secondary | ICD-10-CM

## 2019-07-12 DIAGNOSIS — R5381 Other malaise: Secondary | ICD-10-CM

## 2019-07-12 DIAGNOSIS — E669 Obesity, unspecified: Secondary | ICD-10-CM

## 2019-07-12 DIAGNOSIS — Z0001 Encounter for general adult medical examination with abnormal findings: Secondary | ICD-10-CM

## 2019-07-12 DIAGNOSIS — R7303 Prediabetes: Secondary | ICD-10-CM

## 2019-07-12 MED ORDER — THYROID 120 MG PO TABS: 120.0000 mg | ORAL_TABLET | Freq: Every day | ORAL | 1 refills | Status: DC

## 2019-07-12 NOTE — Progress Notes (Signed)
Subjective:  Patient ID: Lisa Howe, female    DOB: 06-16-1980  Age: 39 y.o. MRN: [id number]  CC:  Chief Complaint  Patient presents with  . Annual Exam      HPI  This patient comes in today for the above.  Immunizations: She did not get a flu shot this year, she has started her COVID-19 vaccine series, she is up-to-date with tetanus shot.  Screenings: She has completed sexual transmitted infection screenings in the past, she does not wish to repeat these today.  She is using an IUD for birth control, but is of childbearing age may want to consider being on a folic acid supplement.  She is a non-smoker.  Depression screening is due today.  She does have a history of prediabetes, PCOS, fatigue which she is being treated off label with desiccated thyroid for.  She has no acute concerns to discuss today.   Past Medical History:  Diagnosis Date  . Anxiety    panic attacks  . Depression    PP anxiety and depression  . Excessive daytime sleepiness   . Gestational diabetes   . HSV-1 (herpes simplex virus 1) infection   . IBS (irritable bowel syndrome)    not sure what caused it  . Migraines   . Obesity (BMI 30-39.9) 01/11/2019  . OCD (obsessive compulsive disorder)   . PCOS (polycystic ovarian syndrome)   . Vaginal Pap smear, abnormal       Family History  Problem Relation Age of Onset  . Hypertension Mother   . Hyperlipidemia Mother   . Thyroid disease Mother   . Hypertension Father   . Hyperlipidemia Father   . Hyperlipidemia Sister   . Cancer Maternal Grandmother        melanoma  . Cancer Paternal Grandmother        liver  . Diabetes Paternal Grandmother   . Parkinson's disease Maternal Grandfather   . Heart disease Maternal Grandfather   . Diabetes Maternal Grandfather   . 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   Social  History   Tobacco Use  . Smoking status: Former Smoker    Types: Cigarettes  . Smokeless tobacco: Never Used  . Tobacco comment: 3-5 cigarettes a day  Substance Use Topics  . Alcohol use: Yes    Comment: socnot since pregnant     Current Meds  Medication Sig  . ALPRAZolam (XANAX) 0.5 MG tablet Take 0.5 mg by mouth at bedtime as needed for anxiety.  . Cholecalciferol (VITAMIN D-3) 125 MCG (5000 UT) TABS Take 2 tablets by mouth daily.  Marland Kitchen gabapentin (NEURONTIN) 300 MG capsule Take 300 mg by mouth 2 (two) times daily.  . metFORMIN (GLUCOPHAGE-XR) 500 MG 24 hr tablet TAKE 2 TABLETS BY MOUTH TWICE DAILY  . NP THYROID 30 MG tablet TAKE 1 TABLET BY MOUTH EVERY DAY AT NOON  . Probiotic Product (FORTIFY PROBIOTIC WOMENS EX ST PO) Take by mouth daily.  . sertraline (ZOLOFT) 100 MG tablet Take 200 mg by mouth daily.  Marland Kitchen spironolactone (ALDACTONE) 50 MG tablet TAKE 1 TABLET(50 MG) BY MOUTH DAILY  . thyroid (NP THYROID) 120 MG tablet Take 1 tablet (120 mg total) by mouth daily before breakfast.  . [DISCONTINUED] NP THYROID 120 MG tablet TAKE 1 TABLET(120 MG) BY MOUTH DAILY    ROS:  Review of Systems  Constitutional: Negative for fever and malaise/fatigue.  Eyes: Positive for blurred vision. Negative for double vision.  Respiratory: Negative for cough and shortness of breath.   Cardiovascular: Positive for palpitations (with anxiety). Negative for chest pain.  Neurological: Negative for dizziness and headaches.  Psychiatric/Behavioral: Negative for suicidal ideas.     Objective:   Today's Vitals: BP 135/80 (BP Location: Left Arm, Patient Position: Sitting, Cuff Size: Normal)   Pulse (!) 102   Temp 97.7 F (36.5 C) (Temporal)   Ht 5' 6.5" (1.689 m)   Wt 245 lb 3.2 oz (111.2 kg)   SpO2 97%   BMI 38.98 kg/m  Vitals with BMI 07/12/2019 01/11/2019 02/20/2017  Height 5' 6.5" 5' 6"  -  Weight 245 lbs 3 oz 228 lbs 3 oz -  BMI 46.50 35.46 -  Systolic 568 127 517  Diastolic 80 80 83  Pulse  102 - 54     Physical Exam Vitals reviewed.  Constitutional:      Appearance: Normal appearance.  HENT:     Head: Normocephalic and atraumatic.     Right Ear: Tympanic membrane, ear canal and external ear normal.     Left Ear: Tympanic membrane, ear canal and external ear normal.  Eyes:     General:        Right eye: No discharge.        Left eye: No discharge.     Extraocular Movements: Extraocular movements intact.     Conjunctiva/sclera: Conjunctivae normal.     Pupils: Pupils are equal, round, and reactive to light.     Funduscopic exam:    Right eye: No hemorrhage or AV nicking. Red reflex present.        Left eye: No hemorrhage or AV nicking. Red reflex present. Neck:     Vascular: No carotid bruit.  Cardiovascular:     Rate and Rhythm: Normal rate and regular rhythm.     Pulses: Normal pulses.     Heart sounds: Normal heart sounds. No murmur.  Pulmonary:     Effort: Pulmonary effort is normal.     Breath sounds: Normal breath sounds.  Chest:     Comments: Patient declined to have breast exam done today, she is planning to have this done her OB/GYN's office next month Abdominal:     General: Abdomen is flat. Bowel sounds are normal. There is no distension.     Palpations: Abdomen is soft. There is no mass.     Tenderness: There is no abdominal tenderness.  Musculoskeletal:        General: No tenderness.     Cervical back: Neck supple. No muscular tenderness.     Right lower leg: No edema.     Left lower leg: No edema.  Lymphadenopathy:     Cervical: No cervical adenopathy.     Upper Body:     Right upper body: No supraclavicular adenopathy.     Left upper body: No supraclavicular adenopathy.  Skin:    General: Skin is warm and dry.  Neurological:     General: No focal deficit present.     Mental Status: She is alert and oriented to person, place, and time.     Motor: No weakness.     Gait: Gait normal.  Psychiatric:        Mood and Affect: Mood normal.          Behavior: Behavior normal.        Judgment: Judgment normal.  PHQ9 SCORE ONLY 07/12/2019 07/12/2019 11/30/2016  Score 0 0 0         Assessment and Plan   1. Encounter for general adult medical examination with abnormal findings   2. Malaise and fatigue   3. Obesity (BMI 30-39.9)   4. Prediabetes      Plan: 1.  I encouraged her to complete her Covid vaccine series as scheduled.  She is going to see her OB/GYN next month to have cervical cancer screening completed.  Will not repeat sexual transmitted infection screening per patient preference.  Did discuss considering being on a folic acid supplement to prevent neural tube defects in and either planned or unplanned pregnancy.  She does not require tobacco cessation conversation.  Depression screen was negative today.  2.  She will continue on her desiccated thyroid for the off label treatment of her fatigue.  We will check thyroid panel today for further evaluation.  I have refilled her prescription as requested.  3.  We did discuss diet today, and she will continue to try to eat whole foods and reduce intake of processed carbohydrates as she is able.  4.  We will check A1c today for further evaluation.  She will continue on her Metformin as prescribed.   Tests ordered Orders Placed This Encounter  Procedures  . Hemoglobin A1c  . TSH  . T3, Free  . T4, Free  . CMP with eGFR([facility])      Meds ordered this encounter  Medications  . thyroid (NP THYROID) 120 MG tablet    Sig: Take 1 tablet (120 mg total) by mouth daily before breakfast.    Dispense:  120 tablet    Refill:  1    Order Specific Question:   Supervising Provider    Answer:   Doree Albee [[id number]]    Patient to follow-up in 3 months or sooner as needed.  Of note, I did not collect lipid panel today.  May consider collecting this in the future.  Ailene Ards, NP

## 2019-07-13 ENCOUNTER — Telehealth (INDEPENDENT_AMBULATORY_CARE_PROVIDER_SITE_OTHER): Payer: Self-pay | Admitting: Nurse Practitioner

## 2019-07-13 LAB — TSH: TSH: 0.18 mIU/L — ABNORMAL LOW

## 2019-07-13 LAB — COMPLETE METABOLIC PANEL WITH GFR
AG Ratio: 1.4 (calc) (ref 1.0–2.5)
ALT: 12 U/L (ref 6–29)
AST: 11 U/L (ref 10–30)
Albumin: 4.2 g/dL (ref 3.6–5.1)
Alkaline phosphatase (APISO): 55 U/L (ref 31–125)
BUN: 14 mg/dL (ref 7–25)
CO2: 27 mmol/L (ref 20–32)
Calcium: 9.5 mg/dL (ref 8.6–10.2)
Chloride: 104 mmol/L (ref 98–110)
Creat: 0.88 mg/dL (ref 0.50–1.10)
GFR, Est African American: 98 mL/min/{1.73_m2} (ref >=60)
GFR, Est Non African American: 85 mL/min/{1.73_m2} (ref >=60)
Globulin: 3 g/dL (calc) (ref 1.9–3.7)
Glucose, Bld: 110 mg/dL — ABNORMAL HIGH (ref 65–99)
Potassium: 4.5 mmol/L (ref 3.5–5.3)
Sodium: 140 mmol/L (ref 135–146)
Total Bilirubin: 0.2 mg/dL (ref 0.2–1.2)
Total Protein: 7.2 g/dL (ref 6.1–8.1)

## 2019-07-13 LAB — HEMOGLOBIN A1C
Hgb A1c MFr Bld: 5.7 % of total Hgb — ABNORMAL HIGH (ref <5.7)
Mean Plasma Glucose: 117 (calc)
eAG (mmol/L): 6.5 (calc)

## 2019-07-13 LAB — T4, FREE: Free T4: 0.9 ng/dL (ref 0.8–1.8)

## 2019-07-13 LAB — T3, FREE: T3, Free: 3.6 pg/mL (ref 2.3–4.2)

## 2019-07-13 NOTE — Telephone Encounter (Signed)
Return call the patient see lab result note for labs drawn on 07/12/19.

## 2019-07-15 ENCOUNTER — Other Ambulatory Visit (INDEPENDENT_AMBULATORY_CARE_PROVIDER_SITE_OTHER): Payer: Self-pay | Admitting: Internal Medicine

## 2019-07-18 ENCOUNTER — Other Ambulatory Visit: Payer: Self-pay

## 2019-07-18 ENCOUNTER — Ambulatory Visit (INDEPENDENT_AMBULATORY_CARE_PROVIDER_SITE_OTHER): Payer: BC Managed Care – PPO | Admitting: Podiatry

## 2019-07-18 DIAGNOSIS — B070 Plantar wart: Secondary | ICD-10-CM

## 2019-07-18 NOTE — Patient Instructions (Addendum)
Keep the bandage on for 24 hours. At that time, remove and clean with soap and water. If it hurts or burns before 24 hours go ahead and remove the bandage and wash with soap and water. Keep the area clean. If there is any blistering cover with antibiotic ointment and a bandage. Monitor for any redness, drainage, or other signs of infection. Call the office if any are to occur. If you have any questions, please call the office at [phone].   I have ordered a medication for you that will come from [facility] in [place]. They should be calling you to verify insurance and will mail the medication to you. If you live close by then you can go by their pharmacy to pick up the medication. Their phone number is [phone]. If you do not hear from them in the next few days, please give [place] a call at [phone].

## 2019-07-19 ENCOUNTER — Telehealth: Payer: Self-pay

## 2019-07-19 NOTE — Progress Notes (Signed)
Subjective:   Patient ID: Lisa Howe, female   DOB: 39 y.o.   MRN: [id number]   HPI 38 year old female presents the office today for concerns of warts to the bottom of her right forefoot.  She is notes a cluster of warts to the area.  She had previously been seen several years ago and she had asked the treatments performed in the did well however recently started to come back.  She states it never fully resolved and she has noticed more spots recently.  Occasional discomfort.  No redness or drainage and swelling.  Review of Systems  All other systems reviewed and are negative.  Past Medical History:  Diagnosis Date  . Anxiety    panic attacks  . Depression    PP anxiety and depression  . Excessive daytime sleepiness   . Gestational diabetes   . HSV-1 (herpes simplex virus 1) infection   . IBS (irritable bowel syndrome)    not sure what caused it  . Migraines   . Obesity (BMI 30-39.9) 01/11/2019  . OCD (obsessive compulsive disorder)   . PCOS (polycystic ovarian syndrome)   . Vaginal Pap smear, abnormal     Past Surgical History:  Procedure Laterality Date  . TONSILLECTOMY    . wisdom teeth        Current Outpatient Medications:  .  ALPRAZolam (XANAX) 0.5 MG tablet, Take 0.5 mg by mouth at bedtime as needed for anxiety., Disp: , Rfl:  .  Cholecalciferol (VITAMIN D-3) 125 MCG (5000 UT) TABS, Take 2 tablets by mouth daily., Disp: , Rfl:  .  gabapentin (NEURONTIN) 300 MG capsule, Take 300 mg by mouth 2 (two) times daily., Disp: , Rfl:  .  metFORMIN (GLUCOPHAGE-XR) 500 MG 24 hr tablet, TAKE 2 TABLETS BY MOUTH TWICE DAILY, Disp: 120 tablet, Rfl: 3 .  NON FORMULARY, [employer], Disp: , Rfl:  .  NP THYROID 30 MG tablet, TAKE 1 TABLET BY MOUTH EVERY DAY AT NOON, Disp: 30 tablet, Rfl: 3 .  Probiotic Product (FORTIFY PROBIOTIC WOMENS EX ST PO), Take by mouth daily., Disp: , Rfl:  .  sertraline (ZOLOFT) 100 MG tablet, Take 200 mg by mouth daily., Disp: , Rfl:  .   spironolactone (ALDACTONE) 50 MG tablet, TAKE 1 TABLET(50 MG) BY MOUTH DAILY, Disp: 90 tablet, Rfl: 0 .  thyroid (NP THYROID) 120 MG tablet, Take 1 tablet (120 mg total) by mouth daily before breakfast., Disp: 120 tablet, Rfl: 1  Allergies  Allergen Reactions  . Advil [Ibuprofen] Anaphylaxis    Coated / dye   . Vancomycin Hives    Hives on chest and back. Tingling around mouth.   . Amoxicillin Rash  . Penicillins Rash    Has patient had a PCN reaction causing immediate rash, facial/tongue/throat swelling, SOB or lightheadedness with hypotension: Yes Has patient had a PCN reaction causing severe rash involving mucus membranes or skin necrosis: No Has patient had a PCN reaction that required hospitalization No Has patient had a PCN reaction occurring within the last 10 years: No If all of the above answers are "NO", then may proceed with Cephalosporin use.           Objective:  Physical Exam  General: AAO x3, NAD  Dermatological: On the right foot just proximal to the metatarsal heads there is a cluster of 5 warts.  There is hyperkeratotic lesions with moderate pain that appear to be verruca.  Also callus formation submetatarsal 5 without any underlying  ulceration drainage or signs of infection.  No open lesions otherwise.  Vascular: Dorsalis Pedis artery and Posterior Tibial artery pedal pulses are 2/4 bilateral with immedate capillary fill time. There is no pain with calf compression, swelling, warmth, erythema.   Neruologic: Grossly intact via light touch bilateral.   Musculoskeletal: No gross boney pedal deformities bilateral. No pain, crepitus, or limitation noted with foot and ankle range of motion bilateral. Muscular strength 5/5 in all groups tested bilateral.  Gait: Unassisted, Nonantalgic.       Assessment:   Verruca right foot     Plan:  -Treatment options discussed including all alternatives, risks, and complications -Etiology of symptoms were  discussed -Sharply debrided lesions with a #312 with scalpel without any complications.  Cleaned with alcohol.  A pad was placed followed by salicylic acid and a bandage.  Post procedure instructions discussed.  Monitor for any signs or symptoms of infection. -I ordered a compound cream today through [facility] for warts.  Discussed side effects.  Trula Slade DPM

## 2019-07-19 NOTE — Telephone Encounter (Signed)
One of the ingredients in the requested compounded prescription is on back order. Do you want to substitute or just leave out? Please call to advise.  Gerald Stabs [phone]

## 2019-07-20 MED ORDER — NONFORMULARY OR COMPOUNDED ITEM: 3 refills | Status: DC

## 2019-07-20 NOTE — Telephone Encounter (Signed)
I called [facility], Gerald Stabs was at lunch. I left a message with Amber to please have a pharmacist call back to let us know what the ingredient is that is on back order.

## 2019-07-20 NOTE — Telephone Encounter (Signed)
Can you please see what medication it is? Thanks.

## 2019-07-20 NOTE — Telephone Encounter (Signed)
[employer], pharmacist states he is not able to find a manufacturer that he can purchase Deoxy-D-Glucose 0.2% and he would need orders to change pt's wart prescription to compound without. I told Gerald Stabs that would be fine.

## 2019-07-26 ENCOUNTER — Ambulatory Visit: Payer: BC Managed Care – PPO | Attending: Internal Medicine

## 2019-07-26 DIAGNOSIS — Z23 Encounter for immunization: Secondary | ICD-10-CM

## 2019-07-26 NOTE — Progress Notes (Signed)
   Covid-19 Vaccination Clinic  Name:  YEHUDIS PODRAZA    MRN: [id number] DOB: 12-15-1980  07/26/2019  Ms. Reasner was observed post Covid-19 immunization for 15 minutes without incident. She was provided with Vaccine Information Sheet and instruction to access the V-Safe system.   Ms. Palanca was instructed to call 911 with any severe reactions post vaccine: Marland Kitchen Difficulty breathing  . Swelling of face and throat  . A fast heartbeat  . A bad rash all over body  . Dizziness and weakness   Immunizations Administered    Name Date Dose VIS Date Route   Moderna COVID-19 Vaccine 07/26/2019  2:02 PM 0.5 mL 03/2019 Intramuscular   Manufacturer: Moderna   Lot: [id number]   [place]:[id number]

## 2019-08-25 DIAGNOSIS — Z01419 Encounter for gynecological examination (general) (routine) without abnormal findings: Secondary | ICD-10-CM | POA: Diagnosis not present

## 2019-08-25 DIAGNOSIS — Z6838 Body mass index (BMI) 38.0-38.9, adult: Secondary | ICD-10-CM | POA: Diagnosis not present

## 2019-08-29 ENCOUNTER — Ambulatory Visit: Payer: BC Managed Care – PPO | Admitting: Podiatry

## 2019-09-06 ENCOUNTER — Other Ambulatory Visit (INDEPENDENT_AMBULATORY_CARE_PROVIDER_SITE_OTHER): Payer: Self-pay | Admitting: Internal Medicine

## 2019-09-26 DIAGNOSIS — F411 Generalized anxiety disorder: Secondary | ICD-10-CM | POA: Diagnosis not present

## 2019-10-10 ENCOUNTER — Other Ambulatory Visit (INDEPENDENT_AMBULATORY_CARE_PROVIDER_SITE_OTHER): Payer: Self-pay | Admitting: Internal Medicine

## 2019-10-17 ENCOUNTER — Ambulatory Visit (INDEPENDENT_AMBULATORY_CARE_PROVIDER_SITE_OTHER): Payer: BC Managed Care – PPO | Admitting: Internal Medicine

## 2019-10-17 ENCOUNTER — Encounter (INDEPENDENT_AMBULATORY_CARE_PROVIDER_SITE_OTHER): Payer: Self-pay | Admitting: Internal Medicine

## 2019-10-17 ENCOUNTER — Other Ambulatory Visit: Payer: Self-pay

## 2019-10-17 VITALS — BP 120/85 | HR 76 | Temp 97.7°F | Ht 66.5 in | Wt 247.4 lb

## 2019-10-17 DIAGNOSIS — R7303 Prediabetes: Secondary | ICD-10-CM

## 2019-10-17 DIAGNOSIS — E669 Obesity, unspecified: Secondary | ICD-10-CM | POA: Diagnosis not present

## 2019-10-17 DIAGNOSIS — E282 Polycystic ovarian syndrome: Secondary | ICD-10-CM | POA: Diagnosis not present

## 2019-10-17 NOTE — Progress Notes (Signed)
Metrics: Intervention Frequency ACO  Documented Smoking Status Yearly  Screened one or more times in 24 months  Cessation Counseling or  Active cessation medication Past 24 months  Past 24 months   Guideline developer: UpToDate (See UpToDate for funding source) Date Released: 2014       Wellness Office Visit  Subjective:  Patient ID: Lisa Howe, female    DOB: June 29, 1980  Age: 39 y.o. MRN: [id number]  CC: This lady comes in for follow-up of obesity, prediabetes, PCOS. HPI  On the last visit with Judson Roch, after blood work, I recommended she increase the dose of NP thyroid which is being used off label for symptoms of thyroid deficiency.  At the time, the patient did not want to do that as I think she was not actually consistent with taking the thyroid medication.  Since that time, she is now consistently taking NP thyroid total dose of 150 mg every morning and she is tolerating it and feels energized.  However, she feels that she does need to increase it further. Unfortunately, her diet is not the best inconsistent at the present time with many stressors that have happened in her life. She is prediabetic with a hemoglobin A1c of 5.7% previously. Past Medical History:  Diagnosis Date  . Anxiety    panic attacks  . Depression    PP anxiety and depression  . Excessive daytime sleepiness   . Gestational diabetes   . HSV-1 (herpes simplex virus 1) infection   . IBS (irritable bowel syndrome)    not sure what caused it  . Migraines   . Obesity (BMI 30-39.9) 01/11/2019  . OCD (obsessive compulsive disorder)   . PCOS (polycystic ovarian syndrome)   . Vaginal Pap smear, abnormal    Past Surgical History:  Procedure Laterality Date  . TONSILLECTOMY    . wisdom teeth        Family History  Problem Relation Age of Onset  . Hypertension Mother   . Hyperlipidemia Mother   . Thyroid disease Mother   . Hypertension Father   . Hyperlipidemia Father   . Hyperlipidemia Sister   .  Cancer Maternal Grandmother        melanoma  . Cancer Paternal Grandmother        liver  . Diabetes Paternal Grandmother   . Parkinson's disease Maternal Grandfather   . Heart disease Maternal Grandfather   . Diabetes Maternal Grandfather   . 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   Social History   Tobacco Use  . Smoking status: Former Smoker    Types: Cigarettes  . Smokeless tobacco: Never Used  . Tobacco comment: 3-5 cigarettes a day  Substance Use Topics  . Alcohol use: Yes    Comment: socnot since pregnant    Current Meds  Medication Sig  . ALPRAZolam (XANAX) 0.5 MG tablet Take 0.5 mg by mouth at bedtime as needed for anxiety.  . Cholecalciferol (VITAMIN D-3) 125 MCG (5000 UT) TABS Take 2 tablets by mouth daily.  Marland Kitchen gabapentin (NEURONTIN) 300 MG capsule Take 300 mg by mouth 2 (two) times daily.  . metFORMIN (GLUCOPHAGE-XR) 500 MG 24 hr tablet TAKE 2 TABLETS BY MOUTH TWICE DAILY  . NP THYROID 30 MG tablet TAKE 1 TABLET BY MOUTH EVERY DAY AT NOON  . Probiotic Product (FORTIFY PROBIOTIC WOMENS EX ST PO) Take by mouth daily.  Marland Kitchen  sertraline (ZOLOFT) 100 MG tablet Take 200 mg by mouth daily.  Marland Kitchen spironolactone (ALDACTONE) 50 MG tablet TAKE 1 TABLET(50 MG) BY MOUTH DAILY  . thyroid (NP THYROID) 120 MG tablet Take 1 tablet (120 mg total) by mouth daily before breakfast.       Depression screen [facility] 2/9 07/12/2019 07/12/2019 11/30/2016  Decreased Interest 0 0 0  Down, Depressed, Hopeless 0 0 0  PHQ - 2 Score 0 0 0     Objective:   Today's Vitals: BP 120/85 (BP Location: Left Arm, Patient Position: Sitting, Cuff Size: Normal)   Pulse 76   Temp 97.7 F (36.5 C) (Temporal)   Ht 5' 6.5" (1.689 m)   Wt 247 lb 6.4 oz (112.2 kg)   SpO2 97%   BMI 39.33 kg/m  Vitals with BMI 10/17/2019 07/12/2019 01/11/2019  Height 5' 6.5" 5' 6.5" 5\' 6"   Weight 247 lbs 6 oz  245 lbs 3 oz 228 lbs 3 oz  BMI 39.34 38.25 05.39  Systolic 767 341 937  Diastolic 85 80 80  Pulse 76 102 -     Physical Exam  She looks systemically well.  She has gained a couple of pounds since last visit.  Overall, she is almost 20 pounds higher than she was in October of last year.  Blood pressure is not too bad.     Assessment   1. Obesity (BMI 30-39.9)   2. Prediabetes   3. PCOS (polycystic ovarian syndrome)       Tests ordered No orders of the defined types were placed in this encounter.    Plan: 1. I am going to increase her NP thyroid so that she is taking a total dose of 180 mg daily and she will let me know how she tolerates this. 2. As far as her obesity and PCOS are concerned, we had another discussion regarding nutrition and in addition to the intermittent fasting, today I discussed with her the importance of a plant-based diet and gave her several examples. 3. I will see her in about 6 weeks time for follow-up and we will do all the blood work. 4. Today I spent 30 minutes with this patient discussing her overall health, reviewing her previous blood work and discussing nutrition above.   No orders of the defined types were placed in this encounter.   Doree Albee, MD

## 2019-10-19 ENCOUNTER — Other Ambulatory Visit (INDEPENDENT_AMBULATORY_CARE_PROVIDER_SITE_OTHER): Payer: Self-pay | Admitting: Internal Medicine

## 2019-10-19 ENCOUNTER — Encounter (INDEPENDENT_AMBULATORY_CARE_PROVIDER_SITE_OTHER): Payer: Self-pay | Admitting: Internal Medicine

## 2019-10-19 MED ORDER — THYROID 60 MG PO TABS: 60.0000 mg | ORAL_TABLET | Freq: Every day | ORAL | 3 refills | Status: DC

## 2019-10-19 NOTE — Telephone Encounter (Signed)
Update on medication.

## 2019-12-06 ENCOUNTER — Ambulatory Visit (INDEPENDENT_AMBULATORY_CARE_PROVIDER_SITE_OTHER): Payer: BC Managed Care – PPO | Admitting: Internal Medicine

## 2019-12-26 DIAGNOSIS — F411 Generalized anxiety disorder: Secondary | ICD-10-CM | POA: Diagnosis not present

## 2020-01-11 ENCOUNTER — Other Ambulatory Visit (INDEPENDENT_AMBULATORY_CARE_PROVIDER_SITE_OTHER): Payer: Self-pay | Admitting: Nurse Practitioner

## 2020-01-19 ENCOUNTER — Other Ambulatory Visit (INDEPENDENT_AMBULATORY_CARE_PROVIDER_SITE_OTHER): Payer: Self-pay | Admitting: Nurse Practitioner

## 2020-01-23 DIAGNOSIS — Z79899 Other long term (current) drug therapy: Secondary | ICD-10-CM | POA: Diagnosis not present

## 2020-01-23 DIAGNOSIS — G43711 Chronic migraine without aura, intractable, with status migrainosus: Secondary | ICD-10-CM | POA: Diagnosis not present

## 2020-01-23 DIAGNOSIS — G5603 Carpal tunnel syndrome, bilateral upper limbs: Secondary | ICD-10-CM | POA: Diagnosis not present

## 2020-01-23 DIAGNOSIS — M542 Cervicalgia: Secondary | ICD-10-CM | POA: Diagnosis not present

## 2020-01-25 DIAGNOSIS — M542 Cervicalgia: Secondary | ICD-10-CM | POA: Insufficient documentation

## 2020-01-25 DIAGNOSIS — G43719 Chronic migraine without aura, intractable, without status migrainosus: Secondary | ICD-10-CM | POA: Insufficient documentation

## 2020-01-31 DIAGNOSIS — F411 Generalized anxiety disorder: Secondary | ICD-10-CM | POA: Diagnosis not present

## 2020-02-08 ENCOUNTER — Other Ambulatory Visit (INDEPENDENT_AMBULATORY_CARE_PROVIDER_SITE_OTHER): Payer: Self-pay | Admitting: Internal Medicine

## 2020-02-08 DIAGNOSIS — Z1152 Encounter for screening for COVID-19: Secondary | ICD-10-CM | POA: Diagnosis not present

## 2020-02-10 LAB — SARS-COV-2 RNA,(COVID-19) QUALITATIVE NAAT: SARS CoV2 RNA: DETECTED — AB

## 2020-02-14 ENCOUNTER — Other Ambulatory Visit (INDEPENDENT_AMBULATORY_CARE_PROVIDER_SITE_OTHER): Payer: Self-pay | Admitting: Internal Medicine

## 2020-02-21 ENCOUNTER — Other Ambulatory Visit (INDEPENDENT_AMBULATORY_CARE_PROVIDER_SITE_OTHER): Payer: Self-pay | Admitting: Internal Medicine

## 2020-02-27 ENCOUNTER — Other Ambulatory Visit (INDEPENDENT_AMBULATORY_CARE_PROVIDER_SITE_OTHER): Payer: Self-pay | Admitting: Internal Medicine

## 2020-03-05 ENCOUNTER — Ambulatory Visit (INDEPENDENT_AMBULATORY_CARE_PROVIDER_SITE_OTHER): Payer: BC Managed Care – PPO | Admitting: Nurse Practitioner

## 2020-03-05 ENCOUNTER — Other Ambulatory Visit: Payer: Self-pay

## 2020-03-05 ENCOUNTER — Encounter (INDEPENDENT_AMBULATORY_CARE_PROVIDER_SITE_OTHER): Payer: Self-pay | Admitting: Nurse Practitioner

## 2020-03-05 VITALS — BP 114/74 | HR 89 | Temp 97.3°F | Ht 66.5 in | Wt 246.8 lb

## 2020-03-05 DIAGNOSIS — R5383 Other fatigue: Secondary | ICD-10-CM

## 2020-03-05 DIAGNOSIS — R5381 Other malaise: Secondary | ICD-10-CM | POA: Diagnosis not present

## 2020-03-05 DIAGNOSIS — R7303 Prediabetes: Secondary | ICD-10-CM | POA: Diagnosis not present

## 2020-03-05 DIAGNOSIS — Z13220 Encounter for screening for lipoid disorders: Secondary | ICD-10-CM

## 2020-03-05 DIAGNOSIS — E559 Vitamin D deficiency, unspecified: Secondary | ICD-10-CM

## 2020-03-05 DIAGNOSIS — E669 Obesity, unspecified: Secondary | ICD-10-CM | POA: Diagnosis not present

## 2020-03-05 MED ORDER — THYROID 180 MG PO TABS: 180.0000 mg | ORAL_TABLET | Freq: Every day | ORAL | 0 refills | Status: DC

## 2020-03-05 NOTE — Patient Instructions (Signed)
To schedule an appointment with [facility] for your lab draw visit [email or web address] or Call: [phone]. Or you may go to [facility] as a walk-in. Their [place] location address is [address], [place], [place]. Their hours are Monday-Friday from 7:00AM-12:00PM and 1:00PM-5:00PM.

## 2020-03-05 NOTE — Progress Notes (Signed)
Subjective:  Patient ID: Lisa Howe, female    DOB: [birth year]  Age: 39 y.o. MRN: [id number]  CC:  Chief Complaint  Patient presents with  . Follow-up    having trouble with insurance on getting her thyroid medication  . Fatigue  . Prediabetes  . Other    Vitamin D deficiency      HPI  This patient arrives today for the above.  Fatigue: She continues on desiccated thyroid for off label treatment of symptoms of thyroid deficiency.  She is tolerating medication well.  Sometimes she has a hard time getting her insurance to approve both 120 mg tablet and 60 mg tablet.  She tells me she feels much better when she is taking 180 mg versus when she can only take the 120 mg.  She denies any negative side effects associated with these medications currently.  Prediabetes: Last A1c was 5.7.  She is due to have this checked again today.  She does continue on Metformin for treatment of PCOS as well as her prediabetes.  She is concerned that she has been on Metformin for about 8 years now and is wondering if she should consider trialing off the medication.  She denies any significant sensation changes except for some mild numbness in her hand which she associates with her carpal tunnel syndrome.  Vitamin D deficiency: She continues on 10,000 IUs of vitamin D3 daily.  She is due for serum level checked today.  Past Medical History:  Diagnosis Date  . Anxiety    panic attacks  . Depression    PP anxiety and depression  . Excessive daytime sleepiness   . Gestational diabetes   . HSV-1 (herpes simplex virus 1) infection   . IBS (irritable bowel syndrome)    not sure what caused it  . Migraines   . Obesity (BMI 30-39.9) 01/11/2019  . OCD (obsessive compulsive disorder)   . PCOS (polycystic ovarian syndrome)   . Vaginal Pap smear, abnormal       Family History  Problem Relation Age of Onset  . Hypertension Mother   . Hyperlipidemia Mother   . Thyroid disease Mother   .  Hypertension Father   . Hyperlipidemia Father   . Hyperlipidemia Sister   . Cancer Maternal Grandmother        melanoma  . Cancer Paternal Grandmother        liver  . Diabetes Paternal Grandmother   . Parkinson's disease Maternal Grandfather   . Heart disease Maternal Grandfather   . Diabetes Maternal Grandfather   . 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   Social History   Tobacco Use  . Smoking status: Former Smoker    Types: Cigarettes  . Smokeless tobacco: Never Used  . Tobacco comment: 3-5 cigarettes a day  Substance Use Topics  . Alcohol use: Yes    Comment: socnot since pregnant     Current Meds  Medication Sig  . ALPRAZolam (XANAX) 0.5 MG tablet Take 0.5 mg by mouth at bedtime as needed for anxiety.  . busPIRone (BUSPAR) 10 MG tablet Take 10 mg by mouth 2 (two) times daily as needed.  . Cholecalciferol (VITAMIN D-3) 125 MCG (5000 UT) TABS Take 2 tablets by mouth daily.  Marland Kitchen gabapentin (NEURONTIN) 300 MG capsule Take 300 mg by mouth 2 (two) times daily.  . metFORMIN (GLUCOPHAGE-XR)  500 MG 24 hr tablet TAKE 2 TABLETS BY MOUTH TWICE DAILY  . Probiotic Product (FORTIFY PROBIOTIC WOMENS EX ST PO) Take by mouth daily.  . rizatriptan (MAXALT) 10 MG tablet Take 10 mg by mouth daily as needed.  . sertraline (ZOLOFT) 100 MG tablet Take 200 mg by mouth daily.  Marland Kitchen spironolactone (ALDACTONE) 50 MG tablet TAKE 1 TABLET(50 MG) BY MOUTH DAILY  . TROKENDI XR 50 MG CP24 Take 1 capsule by mouth daily.  . [DISCONTINUED] thyroid (NP THYROID) 120 MG tablet Take 1 tablet (120 mg total) by mouth daily before breakfast.  . [DISCONTINUED] thyroid (NP THYROID) 60 MG tablet Take 1 tablet (60 mg total) by mouth daily before breakfast.    ROS:  See HPI  Objective:   Today's Vitals: BP 114/74   Pulse 89   Temp (!) 97.3 F (36.3 C) (Temporal)   Ht 5' 6.5" (1.689 m)    Wt 246 lb 12.8 oz (111.9 kg)   SpO2 98%   BMI 39.24 kg/m  Vitals with BMI 03/05/2020 10/17/2019 07/12/2019  Height 5' 6.5" 5' 6.5" 5' 6.5"  Weight 246 lbs 13 oz 247 lbs 6 oz 245 lbs 3 oz  BMI 39.24 42.35 36.14  Systolic 431 540 086  Diastolic 74 85 80  Pulse 89 76 102     Physical Exam Vitals reviewed.  Constitutional:      General: She is not in acute distress.    Appearance: Normal appearance.  HENT:     Head: Normocephalic and atraumatic.  Neck:     Vascular: No carotid bruit.  Cardiovascular:     Rate and Rhythm: Normal rate and regular rhythm.     Pulses: Normal pulses.     Heart sounds: Normal heart sounds.  Pulmonary:     Effort: Pulmonary effort is normal.     Breath sounds: Normal breath sounds.  Skin:    General: Skin is warm and dry.  Neurological:     General: No focal deficit present.     Mental Status: She is alert and oriented to person, place, and time.  Psychiatric:        Mood and Affect: Mood normal.        Behavior: Behavior normal.        Judgment: Judgment normal.          Assessment and Plan   1. Malaise and fatigue   2. Obesity (BMI 30-39.9)   3. Prediabetes   4. Screening for lipid disorders   5. Vitamin D deficiency      Plan: 1.  We will check thyroid panel for further evaluation today. 2., 3.  We will collect blood work for further evaluation.  We did discuss Metformin and that extended release Metformin was recalled due to concerns of cancer related illness associated with this medication a while back, however she has been tolerating this medication well.  We did discuss possibility of coming off of the medicine temporarily, but she would like to continue on it for right now as when she stopped it in the past her A1c increased.  We will check vitamin B 12 level today for further evaluation.  I also offered to change her extended release Metformin to immediate release due to the recall, but she would like to hold off on this as  well.  We will check A1c for further evaluation. 4.  We will check lipid panel as part of her blood work today. 5.  We will check serum  vitamin D level.   Tests ordered Orders Placed This Encounter  Procedures  . TSH  . T3, Free  . T4, Free  . Hemoglobin A1c  . Vitamin D, 25-hydroxy  . Lipid Panel  . CMP with eGFR([facility])  . CBC with Differential/Platelets  . Vitamin B12      Meds ordered this encounter  Medications  . thyroid (ARMOUR) 180 MG tablet    Sig: Take 1 tablet (180 mg total) by mouth daily before breakfast.    Dispense:  90 tablet    Refill:  0    Order Specific Question:   Supervising Provider    Answer:   Doree Albee [[id number]]    Patient to follow-up in April for annual physical exam.  Ailene Ards, NP

## 2020-03-11 ENCOUNTER — Telehealth (INDEPENDENT_AMBULATORY_CARE_PROVIDER_SITE_OTHER): Payer: Self-pay

## 2020-03-11 DIAGNOSIS — R7303 Prediabetes: Secondary | ICD-10-CM

## 2020-03-11 MED ORDER — METFORMIN HCL ER 500 MG PO TB24: 1000.0000 mg | ORAL_TABLET | Freq: Two times a day (BID) | ORAL | 0 refills | Status: DC

## 2020-03-11 NOTE — Telephone Encounter (Signed)
Called patient and LMOVM   Left a detailed voice message for patient to let her know that her medication has been sent to her pharmacy and for her to please get her labs done.

## 2020-03-11 NOTE — Telephone Encounter (Signed)
Refill sent to [facility] on [address]

## 2020-03-11 NOTE — Telephone Encounter (Signed)
Patient called and left a detailed voice message and is requesting a refill of the following medication:  metFORMIN (GLUCOPHAGE-XR) 500 MG 24 hr tablet  Last filled 01/11/2020, # 120 with 0 refills  Last OV 03/05/2020, also has not been able to go to have labs yet.  Patient stated the pharmacy needed a new Rx for this medication.  Patient would like a call back to [phone] when filled.

## 2020-03-19 DIAGNOSIS — F411 Generalized anxiety disorder: Secondary | ICD-10-CM | POA: Diagnosis not present

## 2020-03-22 IMAGING — DX CERVICAL SPINE - COMPLETE 4+ VIEW
5 series · 5 of 5 positions shown · non-contrast
Comparison: None.

CLINICAL DATA: Neck pain radiating to the upper extremities over
the last several weeks.

EXAM:
CERVICAL SPINE - COMPLETE 4+ VIEW

[c-spine lat]
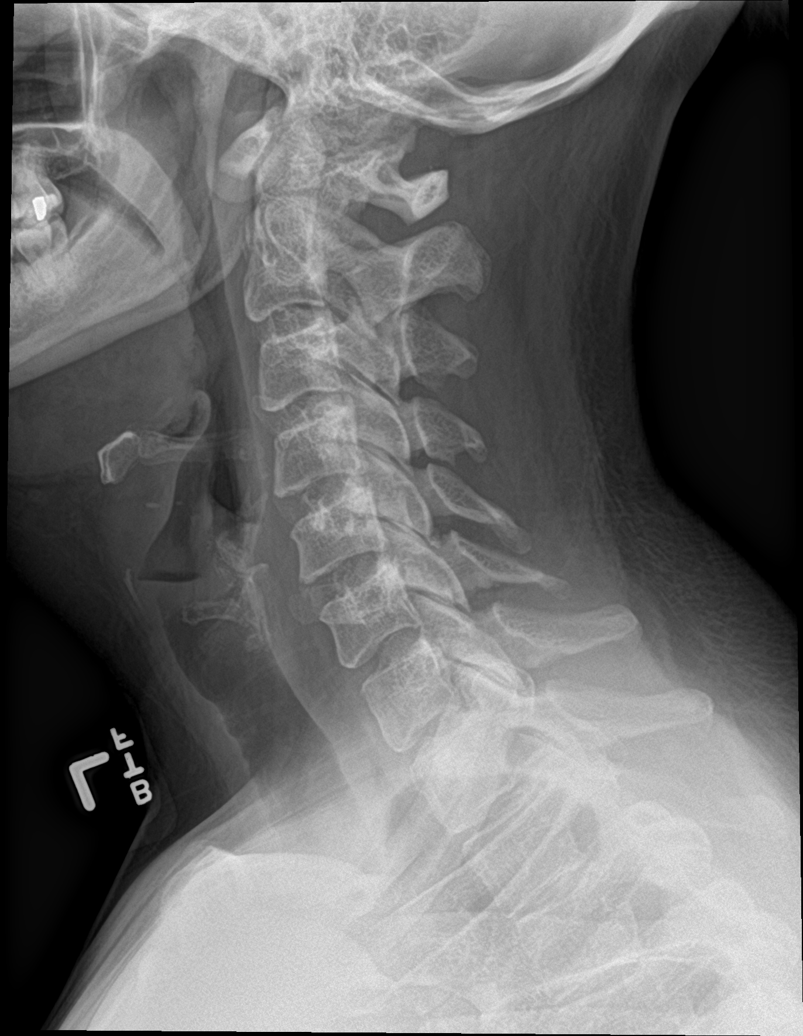

[c-spine obl (1 of 2)]
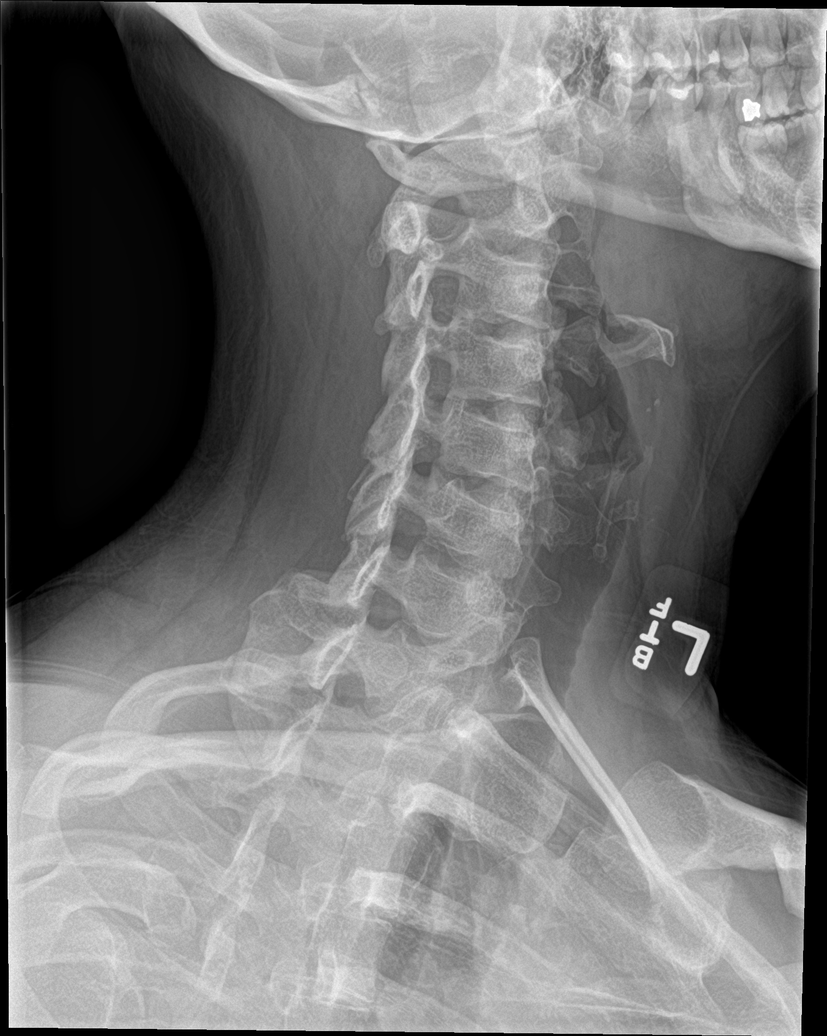

[c-spine obl (2 of 2)]
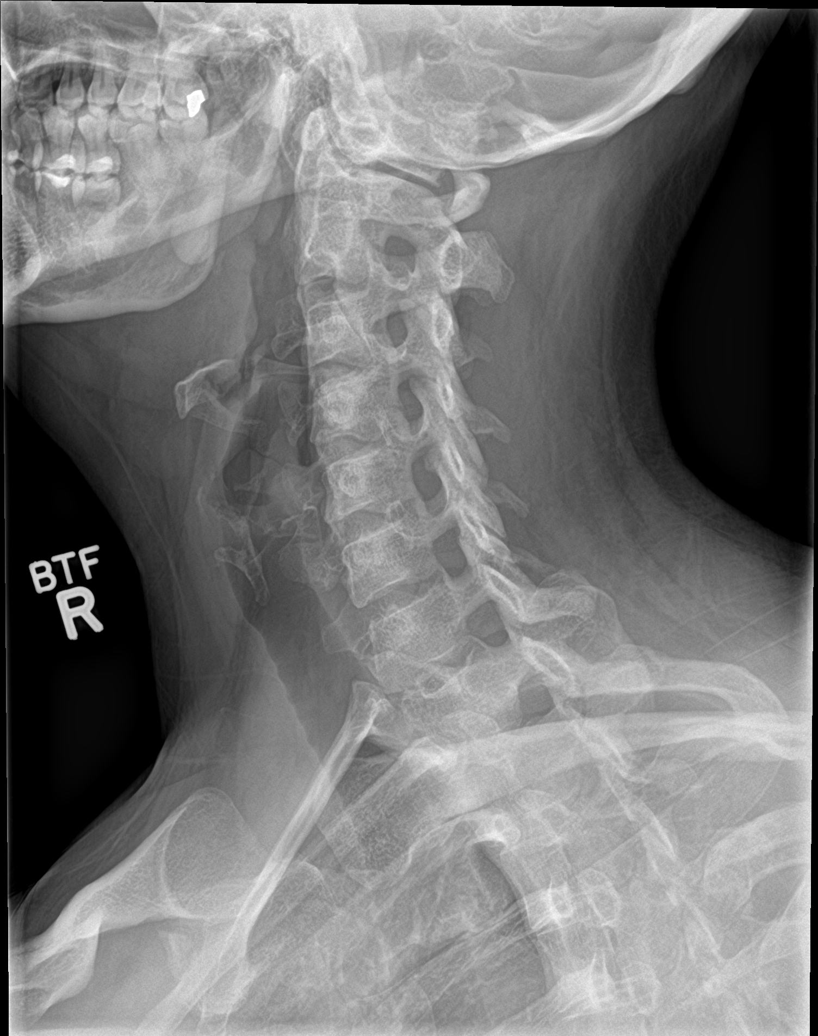

[c-spine ap]
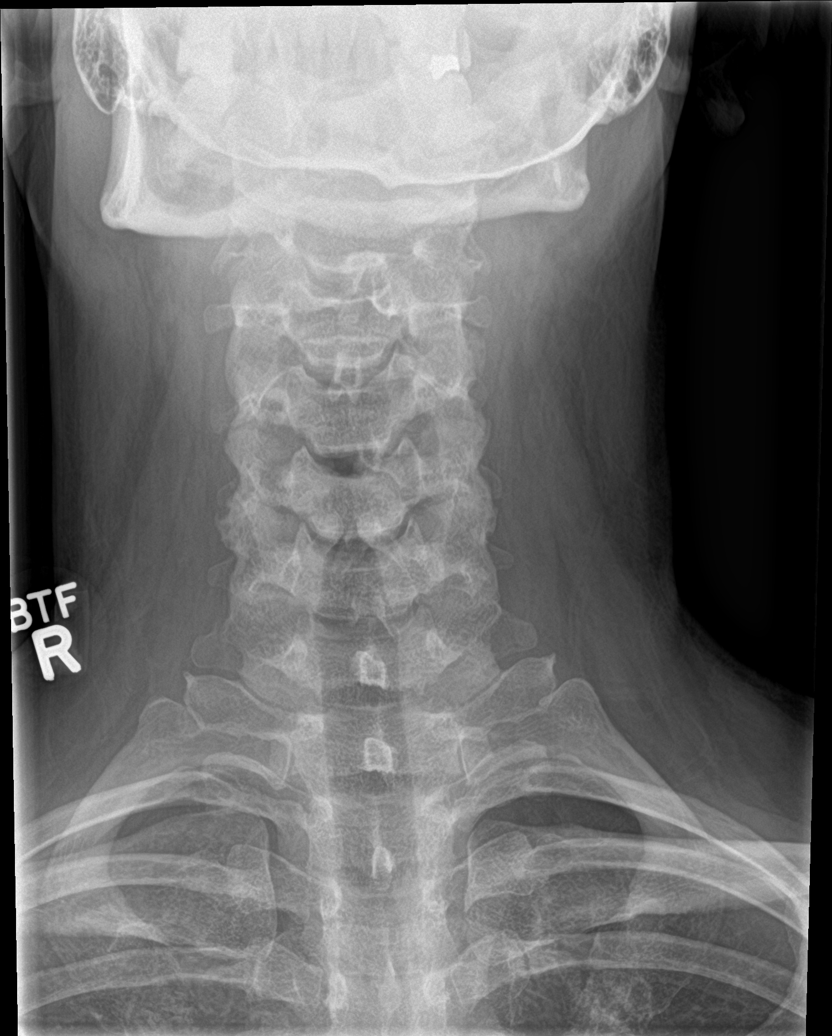

[c-spine open mouth]
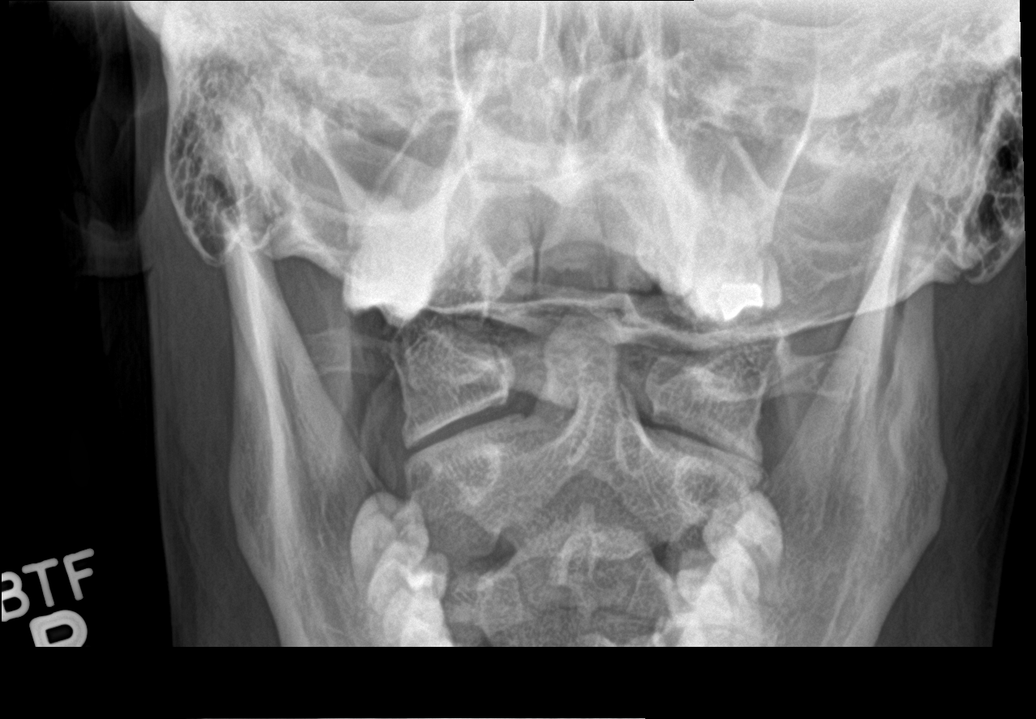

[5 of 5 positions shown; findings below may reference images not displayed]

FINDINGS: Normal alignment. Normal disc spaces. No evidence of advanced facet
arthropathy. Mild midcervical facet degeneration. No bony canal or
foraminal narrowing.
IMPRESSION: No acute or advanced finding.  Mild mid cervical facet degeneration.

## 2020-03-26 ENCOUNTER — Other Ambulatory Visit (INDEPENDENT_AMBULATORY_CARE_PROVIDER_SITE_OTHER): Payer: Self-pay | Admitting: Internal Medicine

## 2020-04-10 ENCOUNTER — Encounter (INDEPENDENT_AMBULATORY_CARE_PROVIDER_SITE_OTHER): Payer: Self-pay | Admitting: Internal Medicine

## 2020-04-10 ENCOUNTER — Telehealth (INDEPENDENT_AMBULATORY_CARE_PROVIDER_SITE_OTHER): Payer: BC Managed Care – PPO | Admitting: Internal Medicine

## 2020-04-10 DIAGNOSIS — H66001 Acute suppurative otitis media without spontaneous rupture of ear drum, right ear: Secondary | ICD-10-CM

## 2020-04-10 MED ORDER — LEVOFLOXACIN 500 MG PO TABS: 500.0000 mg | ORAL_TABLET | Freq: Every day | ORAL | 0 refills | Status: DC

## 2020-04-10 NOTE — Progress Notes (Signed)
Metrics: Intervention Frequency ACO  Documented Smoking Status Yearly  Screened one or more times in 24 months  Cessation Counseling or  Active cessation medication Past 24 months  Past 24 months   Guideline developer: UpToDate (See UpToDate for funding source) Date Released: 2014       Wellness Office Visit  Subjective:  Patient ID: Lisa Howe, female    DOB: Jul 26, 1980  Age: 40 y.o. MRN: [id number]  CC: This is an audio telemedicine visit with the permission of the patient who is at home and I am in my office. I used 2 identifiers to identify the patient. Right ear pain. HPI  The patient has had right ear pain for the last several days associated with reduced hearing, postnasal drainage and a cough productive of green sputum. She denies any dyspnea, fever, myalgias. She did have COVID-19 disease in early November and made a good recovery. At this point in time, she had already had the COVID-19 booster. Past Medical History:  Diagnosis Date  . Anxiety    panic attacks  . Depression    PP anxiety and depression  . Excessive daytime sleepiness   . Gestational diabetes   . HSV-1 (herpes simplex virus 1) infection   . IBS (irritable bowel syndrome)    not sure what caused it  . Migraines   . Obesity (BMI 30-39.9) 01/11/2019  . OCD (obsessive compulsive disorder)   . PCOS (polycystic ovarian syndrome)   . Vaginal Pap smear, abnormal    Past Surgical History:  Procedure Laterality Date  . TONSILLECTOMY    . wisdom teeth        Family History  Problem Relation Age of Onset  . Hypertension Mother   . Hyperlipidemia Mother   . Thyroid disease Mother   . Hypertension Father   . Hyperlipidemia Father   . Hyperlipidemia Sister   . Cancer Maternal Grandmother        melanoma  . Cancer Paternal Grandmother        liver  . Diabetes Paternal Grandmother   . Parkinson's disease Maternal Grandfather   . Heart disease Maternal Grandfather   . Diabetes Maternal Grandfather    . 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   Social History   Tobacco Use  . Smoking status: Former Smoker    Types: Cigarettes  . Smokeless tobacco: Never Used  . Tobacco comment: 3-5 cigarettes a day  Substance Use Topics  . Alcohol use: Yes    Comment: socnot since pregnant    Current Meds  Medication Sig  . ALPRAZolam (XANAX) 0.5 MG tablet Take 0.5 mg by mouth at bedtime as needed for anxiety.  . busPIRone (BUSPAR) 10 MG tablet Take 10 mg by mouth 2 (two) times daily as needed.  . Cholecalciferol (VITAMIN D-3) 125 MCG (5000 UT) TABS Take 2 tablets by mouth daily.  Marland Kitchen gabapentin (NEURONTIN) 300 MG capsule Take 300 mg by mouth 2 (two) times daily.  Marland Kitchen levofloxacin (LEVAQUIN) 500 MG tablet Take 1 tablet (500 mg total) by mouth daily.  . metFORMIN (GLUCOPHAGE-XR) 500 MG 24 hr tablet Take 2 tablets (1,000 mg total) by mouth 2 (two) times daily.  . Probiotic Product (FORTIFY PROBIOTIC WOMENS EX ST PO) Take by mouth daily.  . rizatriptan (MAXALT) 10 MG tablet Take 10 mg by mouth daily as needed.  . sertraline (ZOLOFT) 100 MG  tablet Take 200 mg by mouth daily.  Marland Kitchen spironolactone (ALDACTONE) 50 MG tablet TAKE 1 TABLET(50 MG) BY MOUTH DAILY  . thyroid (ARMOUR) 180 MG tablet Take 1 tablet (180 mg total) by mouth daily before breakfast.  . TROKENDI XR 50 MG CP24 Take 1 capsule by mouth daily.      Depression screen [facility] 2/9 07/12/2019 07/12/2019 11/30/2016  Decreased Interest 0 0 0  Down, Depressed, Hopeless 0 0 0  PHQ - 2 Score 0 0 0     Objective:   Today's Vitals: There were no vitals taken for this visit. Vitals with BMI 04/10/2020 03/05/2020 10/17/2019  Height (No Data) 5' 6.5" 5' 6.5"  Weight (No Data) 246 lbs 13 oz 247 lbs 6 oz  BMI - 39.24 39.34  Systolic (No Data) 114 120  Diastolic (No Data) 74 85  Pulse - 89 76     Physical Exam  Virtual visit.  She appears to be alert and orientated on the phone.     Assessment   1. Non-recurrent acute suppurative otitis media of right ear without spontaneous rupture of tympanic membrane       Tests ordered No orders of the defined types were placed in this encounter.    Plan: 1. I am going to treat this empirically as a otitis media and have sent a course of antibiotics to her pharmacy. 2. She does not improve, she will let me know in about a week to 10 days. 3. This phone call lasted 5 minutes.   Meds ordered this encounter  Medications  . levofloxacin (LEVAQUIN) 500 MG tablet    Sig: Take 1 tablet (500 mg total) by mouth daily.    Dispense:  7 tablet    Refill:  0    Placida Cambre Normajean Glasgow, MD

## 2020-06-06 ENCOUNTER — Other Ambulatory Visit (INDEPENDENT_AMBULATORY_CARE_PROVIDER_SITE_OTHER): Payer: Self-pay | Admitting: Nurse Practitioner

## 2020-06-06 DIAGNOSIS — R7303 Prediabetes: Secondary | ICD-10-CM

## 2020-06-11 DIAGNOSIS — F411 Generalized anxiety disorder: Secondary | ICD-10-CM | POA: Diagnosis not present

## 2020-06-16 ENCOUNTER — Other Ambulatory Visit (INDEPENDENT_AMBULATORY_CARE_PROVIDER_SITE_OTHER): Payer: Self-pay | Admitting: Nurse Practitioner

## 2020-06-16 DIAGNOSIS — R5381 Other malaise: Secondary | ICD-10-CM

## 2020-07-18 ENCOUNTER — Encounter (INDEPENDENT_AMBULATORY_CARE_PROVIDER_SITE_OTHER): Payer: BC Managed Care – PPO | Admitting: Nurse Practitioner

## 2020-07-24 ENCOUNTER — Encounter (INDEPENDENT_AMBULATORY_CARE_PROVIDER_SITE_OTHER): Payer: BC Managed Care – PPO | Admitting: Internal Medicine

## 2020-08-06 DIAGNOSIS — G5601 Carpal tunnel syndrome, right upper limb: Secondary | ICD-10-CM | POA: Diagnosis not present

## 2020-08-06 DIAGNOSIS — G43009 Migraine without aura, not intractable, without status migrainosus: Secondary | ICD-10-CM | POA: Diagnosis not present

## 2020-08-06 DIAGNOSIS — G5603 Carpal tunnel syndrome, bilateral upper limbs: Secondary | ICD-10-CM | POA: Insufficient documentation

## 2020-08-12 ENCOUNTER — Other Ambulatory Visit (INDEPENDENT_AMBULATORY_CARE_PROVIDER_SITE_OTHER): Payer: Self-pay | Admitting: Internal Medicine

## 2020-08-12 ENCOUNTER — Other Ambulatory Visit (INDEPENDENT_AMBULATORY_CARE_PROVIDER_SITE_OTHER): Payer: Self-pay | Admitting: Nurse Practitioner

## 2020-08-12 DIAGNOSIS — R7303 Prediabetes: Secondary | ICD-10-CM

## 2020-08-27 ENCOUNTER — Ambulatory Visit (INDEPENDENT_AMBULATORY_CARE_PROVIDER_SITE_OTHER): Payer: BC Managed Care – PPO | Admitting: Nurse Practitioner

## 2020-08-27 ENCOUNTER — Other Ambulatory Visit: Payer: Self-pay

## 2020-08-27 VITALS — BP 110/84 | HR 77 | Temp 97.0°F | Ht 67.0 in | Wt 254.0 lb

## 2020-08-27 DIAGNOSIS — E669 Obesity, unspecified: Secondary | ICD-10-CM

## 2020-08-27 DIAGNOSIS — Z13220 Encounter for screening for lipoid disorders: Secondary | ICD-10-CM

## 2020-08-27 DIAGNOSIS — R7303 Prediabetes: Secondary | ICD-10-CM

## 2020-08-27 DIAGNOSIS — R5381 Other malaise: Secondary | ICD-10-CM | POA: Diagnosis not present

## 2020-08-27 DIAGNOSIS — Z6839 Body mass index (BMI) 39.0-39.9, adult: Secondary | ICD-10-CM

## 2020-08-27 DIAGNOSIS — R300 Dysuria: Secondary | ICD-10-CM | POA: Diagnosis not present

## 2020-08-27 DIAGNOSIS — E559 Vitamin D deficiency, unspecified: Secondary | ICD-10-CM

## 2020-08-27 DIAGNOSIS — R5383 Other fatigue: Secondary | ICD-10-CM

## 2020-08-27 DIAGNOSIS — Z0001 Encounter for general adult medical examination with abnormal findings: Secondary | ICD-10-CM | POA: Diagnosis not present

## 2020-08-27 DIAGNOSIS — E66812 Obesity, class 2: Secondary | ICD-10-CM

## 2020-08-27 NOTE — Progress Notes (Signed)
Subjective:  Patient ID: Lisa Howe, female    DOB: [birth year]  Age: 40 y.o. MRN: [id number]  CC:  Chief Complaint  Patient presents with  . Annual Exam      HPI  This patient arrives today for the above.  Overall she is feeling well.  She does report some mild dysuria that started initially a week or so ago, eventually went away, but seems to have returned over the last day or so.  She denies any hematuria, nausea, vomiting, fevers.  She sometimes will get yeast infections but feels like this is more of a urinary tract infection would like to be evaluated for this.  Otherwise she is feeling well.  As far as health maintenance is concerned she appears to be up-to-date with recommended vaccinations.  She tells me she is scheduled to have her Pap smear with her OB/GYN next month.  Past Medical History:  Diagnosis Date  . Anxiety    panic attacks  . Depression    PP anxiety and depression  . Excessive daytime sleepiness   . Gestational diabetes   . HSV-1 (herpes simplex virus 1) infection   . IBS (irritable bowel syndrome)    not sure what caused it  . Migraines   . Obesity (BMI 30-39.9) 01/11/2019  . OCD (obsessive compulsive disorder)   . PCOS (polycystic ovarian syndrome)   . Vaginal Pap smear, abnormal       Family History  Problem Relation Age of Onset  . Hypertension Mother   . Hyperlipidemia Mother   . Thyroid disease Mother   . Hypertension Father   . Hyperlipidemia Father   . Hyperlipidemia Sister   . Cancer Maternal Grandmother        melanoma  . Cancer Paternal Grandmother        liver  . Diabetes Paternal Grandmother   . Parkinson's disease Maternal Grandfather   . Heart disease Maternal Grandfather   . Diabetes Maternal Grandfather   . 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   Social History   Tobacco Use  . Smoking  status: Former Smoker    Types: Cigarettes  . Smokeless tobacco: Never Used  . Tobacco comment: 3-5 cigarettes a day  Substance Use Topics  . Alcohol use: Yes    Comment: socnot since pregnant     Current Meds  Medication Sig  . ALPRAZolam (XANAX) 0.5 MG tablet Take 0.5 mg by mouth at bedtime as needed for anxiety.  Francia Greaves THYROID 180 MG tablet TAKE 1 TABLET(180 MG) BY MOUTH DAILY BEFORE BREAKFAST  . busPIRone (BUSPAR) 10 MG tablet Take 10 mg by mouth 2 (two) times daily as needed.  . Cholecalciferol (VITAMIN D-3) 125 MCG (5000 UT) TABS Take 2 tablets by mouth daily.  Marland Kitchen gabapentin (NEURONTIN) 300 MG capsule Take 300 mg by mouth 2 (two) times daily.  . metFORMIN (GLUCOPHAGE-XR) 500 MG 24 hr tablet TAKE 2 TABLETS(1000 MG) BY MOUTH TWICE DAILY  . Probiotic Product (FORTIFY PROBIOTIC WOMENS EX ST PO) Take by mouth daily.  . rizatriptan (MAXALT) 10 MG tablet Take 10 mg by mouth daily as needed.  . sertraline (ZOLOFT) 100 MG tablet Take 200 mg by mouth daily.  Marland Kitchen spironolactone (ALDACTONE) 50 MG tablet TAKE 1 TABLET(50 MG) BY MOUTH DAILY    ROS:  Review of Systems  Constitutional: Negative for fever.  Respiratory: Negative for shortness of breath.   Cardiovascular: Negative for chest pain.  Gastrointestinal: Negative for abdominal pain, nausea and vomiting.  Genitourinary: Positive for dysuria and urgency. Negative for hematuria.  Neurological: Negative for dizziness and headaches.     Objective:   Today's Vitals: BP 110/84   Pulse 77   Temp (!) 97 F (36.1 C)   Ht 5' 7"  (1.702 m)   Wt 254 lb (115.2 kg)   SpO2 98%   BMI 39.78 kg/m  Vitals with BMI 08/27/2020 04/10/2020 03/05/2020  Height 5' 7"  (No Data) 5' 6.5"  Weight 254 lbs (No Data) 246 lbs 13 oz  BMI 49.44 - 96.75  Systolic 916 (No Data) 384  Diastolic 84 (No Data) 74  Pulse 77 - 89     Physical Exam Vitals reviewed.  Constitutional:      Appearance: Normal appearance.  HENT:     Head: Normocephalic and  atraumatic.     Right Ear: Tympanic membrane, ear canal and external ear normal.     Left Ear: Tympanic membrane, ear canal and external ear normal.  Eyes:     General:        Right eye: No discharge.        Left eye: No discharge.     Extraocular Movements: Extraocular movements intact.     Conjunctiva/sclera: Conjunctivae normal.     Pupils: Pupils are equal, round, and reactive to light.  Neck:     Vascular: No carotid bruit.  Cardiovascular:     Rate and Rhythm: Normal rate and regular rhythm.     Pulses: Normal pulses.     Heart sounds: Normal heart sounds. No murmur heard.   Pulmonary:     Effort: Pulmonary effort is normal.     Breath sounds: Normal breath sounds.  Chest:  Breasts:     Right: No supraclavicular adenopathy.     Left: No supraclavicular adenopathy.      Comments: Breast exam deferred per patient preference, she tells me she will get this completed at her OB/GYN next month. Abdominal:     General: Abdomen is flat. Bowel sounds are normal. There is no distension.     Palpations: Abdomen is soft. There is no mass.     Tenderness: There is no abdominal tenderness. There is left CVA tenderness.  Musculoskeletal:        General: No tenderness.     Cervical back: Neck supple. No muscular tenderness.     Right lower leg: No edema.     Left lower leg: No edema.  Lymphadenopathy:     Cervical: No cervical adenopathy.     Upper Body:     Right upper body: No supraclavicular adenopathy.     Left upper body: No supraclavicular adenopathy.  Skin:    General: Skin is warm and dry.  Neurological:     General: No focal deficit present.     Mental Status: She is alert and oriented to person, place, and time.     Motor: No weakness.     Gait: Gait normal.  Psychiatric:        Mood and Affect: Mood normal.        Behavior: Behavior normal.        Judgment: Judgment normal.          Assessment and Plan   1. Encounter for general adult medical examination  with abnormal findings   2. Dysuria   3. Prediabetes   4. Vitamin  D deficiency   5. Screening for lipid disorders   6. Class 2 obesity without serious comorbidity with body mass index (BMI) of 39.0 to 39.9 in adult, unspecified obesity type   7. Malaise and fatigue      Plan: 1.  She is up-to-date with recommended health maintenance screenings except for Pap smear which she will get completed next month.  We will collect blood work for further evaluation today. 2.  We will collect her urine and send off for urinalysis with reflex to culture.  She was also encouraged to consider taking Azo over-the-counter for symptom management while waiting for her results. 3.-7.  We will collect blood work for further evaluation.   Tests ordered Orders Placed This Encounter  Procedures  . Urinalysis with Culture Reflex  . CBC with Differential/Platelets  . CMP with eGFR([facility])  . Lipid Panel  . Hemoglobin A1c  . TSH  . T3, Free  . T4, Free  . Vitamin D, 25-hydroxy      No orders of the defined types were placed in this encounter.   Patient to follow-up in 3-6 months or sooner as needed.  In addition to performing her annual physical exam also performed an office visit to address her concern of dysuria.  Ailene Ards, NP

## 2020-08-28 ENCOUNTER — Other Ambulatory Visit (INDEPENDENT_AMBULATORY_CARE_PROVIDER_SITE_OTHER): Payer: Self-pay | Admitting: Nurse Practitioner

## 2020-08-28 ENCOUNTER — Telehealth (INDEPENDENT_AMBULATORY_CARE_PROVIDER_SITE_OTHER): Payer: Self-pay | Admitting: Nurse Practitioner

## 2020-08-28 DIAGNOSIS — N3000 Acute cystitis without hematuria: Secondary | ICD-10-CM

## 2020-08-28 MED ORDER — NITROFURANTOIN MONOHYD MACRO 100 MG PO CAPS
100.0000 mg | ORAL_CAPSULE | Freq: Two times a day (BID) | ORAL | 0 refills | Status: DC
Start: 2020-08-28 — End: 2021-01-01

## 2020-08-28 NOTE — Telephone Encounter (Signed)
I did call this patient to discuss her lab work results today.  She did not answer the phone but she did give me permission yesterday to leave a detailed voicemail so I did discuss lab work by leaving voicemail on her phone.  I did prescribe Macrobid that she can take for probable UTI based on urinalysis results and reports of dysuria in the office yesterday.  She was encouraged to call me with any questions or concerns.

## 2020-08-29 LAB — CBC WITH DIFFERENTIAL/PLATELET
Absolute Monocytes: 516 cells/uL (ref 200–950)
Basophils Absolute: 27 cells/uL (ref 0–200)
Basophils Relative: 0.3 %
Eosinophils Absolute: 142 cells/uL (ref 15–500)
Eosinophils Relative: 1.6 %
HCT: 35.7 % (ref 35.0–45.0)
Hemoglobin: 11.6 g/dL — ABNORMAL LOW (ref 11.7–15.5)
Lymphs Abs: 2376 cells/uL (ref 850–3900)
MCH: 27 pg (ref 27.0–33.0)
MCHC: 32.5 g/dL (ref 32.0–36.0)
MCV: 83 fL (ref 80.0–100.0)
MPV: 12.1 fL (ref 7.5–12.5)
Monocytes Relative: 5.8 %
Neutro Abs: 5838 cells/uL (ref 1500–7800)
Neutrophils Relative %: 65.6 %
Platelets: 264 10*3/uL (ref 140–400)
RBC: 4.3 10*6/uL (ref 3.80–5.10)
RDW: 13 % (ref 11.0–15.0)
Total Lymphocyte: 26.7 %
WBC: 8.9 10*3/uL (ref 3.8–10.8)

## 2020-08-29 LAB — COMPLETE METABOLIC PANEL WITH GFR
AG Ratio: 1.4 (calc) (ref 1.0–2.5)
ALT: 13 U/L (ref 6–29)
AST: 12 U/L (ref 10–30)
Albumin: 4.2 g/dL (ref 3.6–5.1)
Alkaline phosphatase (APISO): 56 U/L (ref 31–125)
BUN: 16 mg/dL (ref 7–25)
CO2: 29 mmol/L (ref 20–32)
Calcium: 9.3 mg/dL (ref 8.6–10.2)
Chloride: 104 mmol/L (ref 98–110)
Creat: 0.81 mg/dL (ref 0.50–1.10)
GFR, Est African American: 108 mL/min/{1.73_m2} (ref >=60)
GFR, Est Non African American: 93 mL/min/{1.73_m2} (ref >=60)
Globulin: 2.9 g/dL (calc) (ref 1.9–3.7)
Glucose, Bld: 98 mg/dL (ref 65–139)
Potassium: 4.5 mmol/L (ref 3.5–5.3)
Sodium: 140 mmol/L (ref 135–146)
Total Bilirubin: 0.2 mg/dL (ref 0.2–1.2)
Total Protein: 7.1 g/dL (ref 6.1–8.1)

## 2020-08-29 LAB — LIPID PANEL
Cholesterol: 153 mg/dL (ref <200)
HDL: 40 mg/dL — ABNORMAL LOW (ref >=50)
LDL Cholesterol (Calc): 82 mg/dL (calc)
Non-HDL Cholesterol (Calc): 113 mg/dL (calc) (ref <130)
Total CHOL/HDL Ratio: 3.8 (calc) (ref <5.0)
Triglycerides: 215 mg/dL — ABNORMAL HIGH (ref <150)

## 2020-08-29 LAB — TSH: TSH: 0.04 mIU/L — ABNORMAL LOW

## 2020-08-29 LAB — URINE CULTURE

## 2020-08-29 LAB — VITAMIN D 25 HYDROXY (VIT D DEFICIENCY, FRACTURES): Vit D, 25-Hydroxy: 84 ng/mL (ref 30–100)

## 2020-08-29 LAB — T3, FREE: T3, Free: 3.9 pg/mL (ref 2.3–4.2)

## 2020-08-29 LAB — URINALYSIS W MICROSCOPIC + REFLEX CULTURE
Bacteria, UA: NONE SEEN /HPF
Bilirubin Urine: NEGATIVE
Glucose, UA: NEGATIVE
Hgb urine dipstick: NEGATIVE
Hyaline Cast: NONE SEEN /LPF
Ketones, ur: NEGATIVE
Nitrites, Initial: NEGATIVE
Protein, ur: NEGATIVE
RBC / HPF: NONE SEEN /HPF (ref 0–2)
Specific Gravity, Urine: 1.021 (ref 1.001–1.035)
pH: 6 (ref 5.0–8.0)

## 2020-08-29 LAB — T4, FREE: Free T4: 1 ng/dL (ref 0.8–1.8)

## 2020-08-29 LAB — HEMOGLOBIN A1C
Hgb A1c MFr Bld: 6 % of total Hgb — ABNORMAL HIGH (ref <5.7)
Mean Plasma Glucose: 126 mg/dL
eAG (mmol/L): 7 mmol/L

## 2020-08-29 LAB — CULTURE INDICATED

## 2020-08-30 DIAGNOSIS — G5601 Carpal tunnel syndrome, right upper limb: Secondary | ICD-10-CM | POA: Diagnosis not present

## 2020-09-03 ENCOUNTER — Encounter (INDEPENDENT_AMBULATORY_CARE_PROVIDER_SITE_OTHER): Payer: Self-pay | Admitting: Internal Medicine

## 2020-09-25 DIAGNOSIS — F411 Generalized anxiety disorder: Secondary | ICD-10-CM | POA: Diagnosis not present

## 2020-10-01 DIAGNOSIS — Z6838 Body mass index (BMI) 38.0-38.9, adult: Secondary | ICD-10-CM | POA: Diagnosis not present

## 2020-10-01 DIAGNOSIS — Z01419 Encounter for gynecological examination (general) (routine) without abnormal findings: Secondary | ICD-10-CM | POA: Diagnosis not present

## 2020-10-09 ENCOUNTER — Encounter: Payer: Self-pay | Admitting: Orthopedic Surgery

## 2020-10-09 DIAGNOSIS — G5601 Carpal tunnel syndrome, right upper limb: Secondary | ICD-10-CM | POA: Diagnosis not present

## 2020-10-09 DIAGNOSIS — M25531 Pain in right wrist: Secondary | ICD-10-CM | POA: Insufficient documentation

## 2020-10-30 ENCOUNTER — Other Ambulatory Visit (INDEPENDENT_AMBULATORY_CARE_PROVIDER_SITE_OTHER): Payer: Self-pay | Admitting: Internal Medicine

## 2020-10-30 ENCOUNTER — Telehealth (INDEPENDENT_AMBULATORY_CARE_PROVIDER_SITE_OTHER): Payer: Self-pay | Admitting: Nurse Practitioner

## 2020-10-30 DIAGNOSIS — R5381 Other malaise: Secondary | ICD-10-CM

## 2020-10-30 NOTE — Telephone Encounter (Signed)
Left a voice message for patient to call back to clarify her medication.

## 2020-10-30 NOTE — Telephone Encounter (Signed)
Will you call and verify how she takes her armour thyroid? I had a medication refill request but the sig is unclear. I want to make sure I provide her with the correct instructions.

## 2020-10-31 DIAGNOSIS — J019 Acute sinusitis, unspecified: Secondary | ICD-10-CM | POA: Diagnosis not present

## 2020-10-31 DIAGNOSIS — U099 Post covid-19 condition, unspecified: Secondary | ICD-10-CM | POA: Diagnosis not present

## 2020-10-31 DIAGNOSIS — H66002 Acute suppurative otitis media without spontaneous rupture of ear drum, left ear: Secondary | ICD-10-CM | POA: Diagnosis not present

## 2020-11-04 ENCOUNTER — Telehealth (INDEPENDENT_AMBULATORY_CARE_PROVIDER_SITE_OTHER): Payer: Self-pay

## 2020-11-04 DIAGNOSIS — H9203 Otalgia, bilateral: Secondary | ICD-10-CM

## 2020-11-05 NOTE — Telephone Encounter (Signed)
Yes she did . She said she was treated for double ear infection. She wanted to be follow up by ENT to make sure is get better.

## 2020-11-05 NOTE — Telephone Encounter (Signed)
Did she get treatment/evaluation for covid already? It sound like she did if she went to the emergency room, but I just wanted to verify. If she has not I highly recommend she do a mychart virtual urgent care visit or she proceed to urgent care as the antivirals need to be started within a short window of time to be effective. And of course if she is feeling severely unwell (very short of breath) then she needs to go to the emergency department.   I have approved referral to ENT.

## 2020-12-20 ENCOUNTER — Telehealth: Payer: BC Managed Care – PPO | Admitting: Internal Medicine

## 2020-12-24 ENCOUNTER — Telehealth: Payer: BC Managed Care – PPO | Admitting: Registered Nurse

## 2020-12-24 ENCOUNTER — Other Ambulatory Visit: Payer: Self-pay

## 2020-12-24 ENCOUNTER — Other Ambulatory Visit: Payer: Self-pay | Admitting: Registered Nurse

## 2020-12-24 DIAGNOSIS — R7303 Prediabetes: Secondary | ICD-10-CM

## 2020-12-24 MED ORDER — METFORMIN HCL ER 500 MG PO TB24: 1000.0000 mg | ORAL_TABLET | Freq: Two times a day (BID) | ORAL | 0 refills | Status: DC

## 2020-12-25 DIAGNOSIS — F411 Generalized anxiety disorder: Secondary | ICD-10-CM | POA: Diagnosis not present

## 2021-01-01 ENCOUNTER — Encounter: Payer: Self-pay | Admitting: Registered Nurse

## 2021-01-01 ENCOUNTER — Other Ambulatory Visit: Payer: Self-pay

## 2021-01-01 ENCOUNTER — Ambulatory Visit (INDEPENDENT_AMBULATORY_CARE_PROVIDER_SITE_OTHER): Payer: BC Managed Care – PPO | Admitting: Registered Nurse

## 2021-01-01 VITALS — BP 119/81 | HR 81 | Temp 98.2°F | Resp 18 | Ht 67.0 in | Wt 249.2 lb

## 2021-01-01 DIAGNOSIS — E282 Polycystic ovarian syndrome: Secondary | ICD-10-CM | POA: Diagnosis not present

## 2021-01-01 DIAGNOSIS — Z23 Encounter for immunization: Secondary | ICD-10-CM

## 2021-01-01 DIAGNOSIS — E1169 Type 2 diabetes mellitus with other specified complication: Secondary | ICD-10-CM

## 2021-01-01 DIAGNOSIS — R7303 Prediabetes: Secondary | ICD-10-CM | POA: Diagnosis not present

## 2021-01-01 DIAGNOSIS — E039 Hypothyroidism, unspecified: Secondary | ICD-10-CM

## 2021-01-01 MED ORDER — THYROID 60 MG PO TABS: 60.0000 mg | ORAL_TABLET | Freq: Every day | ORAL | 0 refills | Status: DC

## 2021-01-01 MED ORDER — SEMAGLUTIDE(0.25 OR 0.5MG/DOS) 2 MG/1.5ML ~~LOC~~ SOPN: 0.5000 mg | PEN_INJECTOR | SUBCUTANEOUS | 0 refills | Status: DC

## 2021-01-01 MED ORDER — SPIRONOLACTONE 50 MG PO TABS: ORAL_TABLET | ORAL | 1 refills | Status: DC

## 2021-01-01 MED ORDER — THYROID 30 MG PO TABS: 30.0000 mg | ORAL_TABLET | Freq: Every day | ORAL | 0 refills | Status: DC

## 2021-01-01 MED ORDER — THYROID 120 MG PO TABS
120.0000 mg | ORAL_TABLET | Freq: Every day | ORAL | 0 refills | Status: DC
Start: 2021-01-01 — End: 2021-05-04

## 2021-01-01 NOTE — Progress Notes (Signed)
Established Patient Office Visit  Subjective:  Patient ID: Lisa Howe, female    DOB: Jul 20, 1980  Age: 40 y.o. MRN: [id number]  CC:  Chief Complaint  Patient presents with   New Patient (Initial Visit)    Patient states she is here to establish care. Patient states she would like to discuss PCOS    HPI Lisa Howe presents for visit to est care.   Histories reviewed and updated with patient.   Former pt of Dr. Anastasio Champion.   Thyroid: she is on NP Thyroid 180mg  po qd. Thinking about tapering and stopping. Notes her sister is NP with Endo, would prefer her to taper off of NP thyroid/Armour Last TSH 0.04.   PCOS: Dx while seeking pregnancy. Has been on metformin 1000mg  po bid ac, spironolactone 50mg  po qd.  Good effect, particularly against hirsutism, but notes weight is very stubborn and cysts still occur and rupture, which is very painful. Interested in exploring options.    Past Medical History:  Diagnosis Date   Anxiety    panic attacks   Depression    PP anxiety and depression   Excessive daytime sleepiness    Gestational diabetes    HSV-1 (herpes simplex virus 1) infection    IBS (irritable bowel syndrome)    not sure what caused it   Migraines    Obesity (BMI 30-39.9) 01/11/2019   OCD (obsessive compulsive disorder)    PCOS (polycystic ovarian syndrome)    Vaginal Pap smear, abnormal     Past Surgical History:  Procedure Laterality Date   TONSILLECTOMY     wisdom teeth       Family History  Problem Relation Age of Onset   Hypertension Mother    Hyperlipidemia Mother    Thyroid disease Mother    Hypertension Father    Hyperlipidemia Father    Pulmonary fibrosis Father    Hyperlipidemia Sister    Cancer Maternal Grandmother        melanoma   Parkinson's disease Maternal Grandfather    Heart disease Maternal Grandfather    Diabetes Maternal Grandfather    Cancer Paternal Grandmother        liver   Diabetes Paternal Grandmother    Heart disease  Paternal Grandfather     Social History   Socioeconomic History   Marital status: Married    Spouse name: Not on file   Number of children: Not on file   Years of education: Not on file   Highest education level: Not on file  Occupational History   Not on file  Tobacco Use   Smoking status: Former    Types: Cigarettes   Smokeless tobacco: Never   Tobacco comments:    3-5 cigarettes a day  Vaping Use   Vaping Use: Never used  Substance and Sexual Activity   Alcohol use: Yes    Comment: socnot since pregnant   Drug use: No   Sexual activity: Yes    Partners: Male    Birth control/protection: Condom  Other Topics Concern   Not on file  Social History Narrative   Married 6 years,second.First marriage lasted 7 years.Banker at [employer].   Social Determinants of Health   Financial Resource Strain: Not on file  Food Insecurity: Not on file  Transportation Needs: Not on file  Physical Activity: Not on file  Stress: Not on file  Social Connections: Not on file  Intimate Partner Violence: Not on file    Outpatient Medications Prior  to Visit  Medication Sig Dispense Refill   ALPRAZolam (XANAX) 0.5 MG tablet Take 0.5 mg by mouth at bedtime as needed for anxiety.     ARMOUR THYROID 180 MG tablet TAKE 1 TABLET(180 MG) BY MOUTH DAILY BEFORE AND BREAKFAST 90 tablet 0   busPIRone (BUSPAR) 10 MG tablet Take 10 mg by mouth 2 (two) times daily as needed.     Cholecalciferol (VITAMIN D-3) 125 MCG (5000 UT) TABS Take 2 tablets by mouth daily.     gabapentin (NEURONTIN) 300 MG capsule Take 300 mg by mouth 2 (two) times daily.     metFORMIN (GLUCOPHAGE-XR) 500 MG 24 hr tablet Take 2 tablets (1,000 mg total) by mouth 2 (two) times daily before a meal. 360 tablet 0   Probiotic Product (FORTIFY PROBIOTIC WOMENS EX ST PO) Take by mouth daily.     rizatriptan (MAXALT) 10 MG tablet Take 10 mg by mouth daily as needed.     sertraline (ZOLOFT) 100 MG tablet Take 200 mg by mouth daily.      nitrofurantoin, macrocrystal-monohydrate, (MACROBID) 100 MG capsule Take 1 capsule (100 mg total) by mouth 2 (two) times daily. 10 capsule 0   spironolactone (ALDACTONE) 50 MG tablet TAKE 1 TABLET(50 MG) BY MOUTH DAILY 30 tablet 3   No facility-administered medications prior to visit.    Allergies  Allergen Reactions   Advil [Ibuprofen] Anaphylaxis    Coated / dye    Vancomycin Hives    Hives on chest and back. Tingling around mouth.    Amoxicillin Rash   Penicillins Rash    Has patient had a PCN reaction causing immediate rash, facial/tongue/throat swelling, SOB or lightheadedness with hypotension: Yes Has patient had a PCN reaction causing severe rash involving mucus membranes or skin necrosis: No Has patient had a PCN reaction that required hospitalization No Has patient had a PCN reaction occurring within the last 10 years: No If all of the above answers are "NO", then may proceed with Cephalosporin use.     ROS Review of Systems  Constitutional: Negative.   HENT: Negative.    Eyes: Negative.   Respiratory: Negative.    Cardiovascular: Negative.   Gastrointestinal: Negative.   Genitourinary: Negative.   Musculoskeletal: Negative.   Skin: Negative.   Neurological: Negative.   Psychiatric/Behavioral: Negative.    All other systems reviewed and are negative.    Objective:    Physical Exam Vitals and nursing note reviewed.  Constitutional:      General: She is not in acute distress.    Appearance: Normal appearance. She is normal weight. She is not ill-appearing, toxic-appearing or diaphoretic.  Cardiovascular:     Rate and Rhythm: Normal rate and regular rhythm.     Heart sounds: Normal heart sounds. No murmur heard.   No friction rub. No gallop.  Pulmonary:     Effort: Pulmonary effort is normal. No respiratory distress.     Breath sounds: Normal breath sounds. No stridor. No wheezing, rhonchi or rales.  Chest:     Chest wall: No tenderness.  Skin:    General:  Skin is warm and dry.  Neurological:     General: No focal deficit present.     Mental Status: She is alert and oriented to person, place, and time. Mental status is at baseline.  Psychiatric:        Mood and Affect: Mood normal.        Behavior: Behavior normal.        Thought  Content: Thought content normal.        Judgment: Judgment normal.    BP 119/81   Pulse 81   Temp 98.2 F (36.8 C) (Temporal)   Resp 18   Ht 5\' 7"  (1.702 m)   Wt 249 lb 3.2 oz (113 kg)   BMI 39.03 kg/m  Wt Readings from Last 3 Encounters:  01/01/21 249 lb 3.2 oz (113 kg)  08/27/20 254 lb (115.2 kg)  03/05/20 246 lb 12.8 oz (111.9 kg)     There are no preventive care reminders to display for this patient.   There are no preventive care reminders to display for this patient.  Lab Results  Component Value Date   TSH 0.04 (L) 08/27/2020   Lab Results  Component Value Date   WBC 8.9 08/27/2020   HGB 11.6 (L) 08/27/2020   HCT 35.7 08/27/2020   MCV 83.0 08/27/2020   PLT 264 08/27/2020   Lab Results  Component Value Date   NA 140 08/27/2020   K 4.5 08/27/2020   CO2 29 08/27/2020   GLUCOSE 98 08/27/2020   BUN 16 08/27/2020   CREATININE 0.81 08/27/2020   BILITOT 0.2 08/27/2020   ALKPHOS 83 09/23/2015   AST 12 08/27/2020   ALT 13 08/27/2020   PROT 7.1 08/27/2020   ALBUMIN 2.8 (L) 09/23/2015   CALCIUM 9.3 08/27/2020   ANIONGAP 7 09/23/2015   Lab Results  Component Value Date   CHOL 153 08/27/2020   Lab Results  Component Value Date   HDL 40 (L) 08/27/2020   Lab Results  Component Value Date   LDLCALC 82 08/27/2020   Lab Results  Component Value Date   TRIG 215 (H) 08/27/2020   Lab Results  Component Value Date   CHOLHDL 3.8 08/27/2020   Lab Results  Component Value Date   HGBA1C 6.0 (H) 08/27/2020      Assessment & Plan:   Problem List Items Addressed This Visit       Endocrine   PCOS (polycystic ovarian syndrome)   Relevant Medications   spironolactone  (ALDACTONE) 50 MG tablet   Other Relevant Orders   CBC with Differential/Platelet   Lipid panel   Diabetes mellitus ([place])   Relevant Medications   Semaglutide,0.25 or 0.5MG /DOS, 2 MG/1.5ML SOPN   Other Relevant Orders   CBC with Differential/Platelet   Hemoglobin A1c   Comprehensive metabolic panel   Lipid panel   Hypothyroidism - Primary   Relevant Medications   thyroid (ARMOUR THYROID) 120 MG tablet   thyroid (ARMOUR THYROID) 60 MG tablet   thyroid (ARMOUR THYROID) 30 MG tablet   Other Relevant Orders   CBC with Differential/Platelet   TSH   Lipid panel   Other Visit Diagnoses     Prediabetes       Relevant Orders   CBC with Differential/Platelet   Hemoglobin A1c   Comprehensive metabolic panel   Lipid panel   Flu vaccine need       Relevant Orders   Flu Vaccine QUAD 6+ mos PF IM (Fluarix Quad PF) (Completed)       Meds ordered this encounter  Medications   thyroid (ARMOUR THYROID) 120 MG tablet    Sig: Take 1 tablet (120 mg total) by mouth daily before breakfast.    Dispense:  30 tablet    Refill:  0    Order Specific Question:   Supervising Provider    Answer:   Carlota Raspberry, JEFFREY R [[id number]]   thyroid (ARMOUR THYROID)  60 MG tablet    Sig: Take 1 tablet (60 mg total) by mouth daily before breakfast.    Dispense:  30 tablet    Refill:  0    Order Specific Question:   Supervising Provider    Answer:   Carlota Raspberry, JEFFREY R [[id number]]   thyroid (ARMOUR THYROID) 30 MG tablet    Sig: Take 1 tablet (30 mg total) by mouth daily before breakfast.    Dispense:  30 tablet    Refill:  0    Order Specific Question:   Supervising Provider    Answer:   Carlota Raspberry, JEFFREY R [[id number]]   spironolactone (ALDACTONE) 50 MG tablet    Sig: TAKE 1 TABLET(50 MG) BY MOUTH DAILY    Dispense:  90 tablet    Refill:  1    Order Specific Question:   Supervising Provider    Answer:   Carlota Raspberry, JEFFREY R [[id number]]   Semaglutide,0.25 or 0.5MG /DOS, 2 MG/1.5ML SOPN    Sig: Inject 0.5 mg into the skin once a  week.    Dispense:  4.5 mL    Refill:  0    Order Specific Question:   Supervising Provider    Answer:   Carlota Raspberry, JEFFREY R [[id number]]    Follow-up: Return in about 3 months (around 04/02/2021) for thyroid, pcos.   PLAN Labs collected. Pt to return in coming days to have draw. Will follow up as warranted Will pursue coverage for semaglutide 0.5mg  subq weekly for weight management, prediabetes secondary to insulin resistance due to PCOS. Reviewed risks, benefits, side effects of this medication with patient who voices understanding. Taper off of armour Thyroid as discussed. Reduce by 30mg  monthly. Will monitor labs Patient encouraged to call clinic with any questions, comments, or concerns.  Maximiano Coss, NP

## 2021-01-01 NOTE — Patient Instructions (Addendum)
Ms. Khachatryan -  Doristine Devoid to meet you. So sorry to hear about Dr. Lanice Shirts passing.  I recommend Lyn Henri MD for continuation of NP Thyroid if desired. We will plan to taper otherwise.  Start semaglutide. May take a week or two to determine insurance coverage.   Refilled spironolactone.   See you in 3 mo  Pop by for labs in the next few weeks  Thanks  Rich     If you have lab work done today you will be contacted with your lab results within the next 2 weeks.  If you have not heard from [place] then please contact us. The fastest way to get your results is to register for My Chart.   IF you received an x-ray today, you will receive an invoice from [facility]. Please contact [facility] at [phone] with questions or concerns regarding your invoice.   IF you received labwork today, you will receive an invoice from [facility]. Please contact [facility] at [phone] with questions or concerns regarding your invoice.   Our billing staff will not be able to assist you with questions regarding bills from these companies.  You will be contacted with the lab results as soon as they are available. The fastest way to get your results is to activate your My Chart account. Instructions are located on the last page of this paperwork. If you have not heard from [place] regarding the results in 2 weeks, please contact this office.

## 2021-01-08 ENCOUNTER — Telehealth: Payer: Self-pay

## 2021-01-08 NOTE — Telephone Encounter (Signed)
Patient stated that Ozempic is too expensive and insurance will not cover. She wants something else prescribed and to use a different pharmacy. Please advise

## 2021-01-08 NOTE — Telephone Encounter (Signed)
Caller name:Shiana Laurance Flatten   On DPR? :yes  Call back [phone]  Provider they see: Maximiano Coss   Reason for call:Pt was prescribed Ozempic and the pharmacy saying want pay for it and when she downloads the manufacture coupon the pharmacy will not take since it's not covered by insurance. Pt said RX will cost 2300.00 with out insurance and wants to know if something else can be called in place of this.  [facility] [place] but if you send a new RX she wants it sent to [facility] since she has had bad customer service with the [place] office

## 2021-01-12 ENCOUNTER — Other Ambulatory Visit: Payer: Self-pay | Admitting: Registered Nurse

## 2021-01-12 DIAGNOSIS — R7303 Prediabetes: Secondary | ICD-10-CM

## 2021-01-17 ENCOUNTER — Other Ambulatory Visit: Payer: Self-pay | Admitting: Registered Nurse

## 2021-01-17 DIAGNOSIS — R7303 Prediabetes: Secondary | ICD-10-CM

## 2021-01-17 MED ORDER — DULAGLUTIDE 1.5 MG/0.5ML ~~LOC~~ SOAJ: 1.5000 mg | SUBCUTANEOUS | 0 refills | Status: DC

## 2021-01-17 NOTE — Telephone Encounter (Signed)
Called and spoke to patient about concerns of pricing for her Ozempic. Notified patient per you that you have sent in Trulicity and to let us know if insurance will cover it. Patient understood. No further concerns at this time.

## 2021-01-17 NOTE — Telephone Encounter (Signed)
Will try trulicity to see if we can get coverage there  Thanks  Rich

## 2021-01-17 NOTE — Progress Notes (Signed)
Ozempic not covered. Will try dulaglutide.  Kathrin Ruddy, NP

## 2021-01-20 NOTE — Telephone Encounter (Signed)
Thank you Rich!

## 2021-03-16 ENCOUNTER — Encounter: Payer: Self-pay | Admitting: Registered Nurse

## 2021-03-17 ENCOUNTER — Telehealth: Payer: Self-pay

## 2021-03-17 NOTE — Telephone Encounter (Signed)
Error

## 2021-03-17 NOTE — Telephone Encounter (Signed)
Please see patient messages below about the cost of medication.

## 2021-03-18 ENCOUNTER — Encounter: Payer: Self-pay | Admitting: Registered Nurse

## 2021-03-18 ENCOUNTER — Other Ambulatory Visit (INDEPENDENT_AMBULATORY_CARE_PROVIDER_SITE_OTHER): Payer: BC Managed Care – PPO

## 2021-03-18 DIAGNOSIS — R7303 Prediabetes: Secondary | ICD-10-CM

## 2021-03-18 DIAGNOSIS — E039 Hypothyroidism, unspecified: Secondary | ICD-10-CM

## 2021-03-18 DIAGNOSIS — E1169 Type 2 diabetes mellitus with other specified complication: Secondary | ICD-10-CM

## 2021-03-18 DIAGNOSIS — E282 Polycystic ovarian syndrome: Secondary | ICD-10-CM | POA: Diagnosis not present

## 2021-03-18 LAB — CBC WITH DIFFERENTIAL/PLATELET
Basophils Absolute: 0 10*3/uL (ref 0.0–0.1)
Basophils Relative: 0.5 % (ref 0.0–3.0)
Eosinophils Absolute: 0.1 10*3/uL (ref 0.0–0.7)
Eosinophils Relative: 1.3 % (ref 0.0–5.0)
HCT: 36.1 % (ref 36.0–46.0)
Hemoglobin: 12 g/dL (ref 12.0–15.0)
Lymphocytes Relative: 27.5 % (ref 12.0–46.0)
Lymphs Abs: 1.8 10*3/uL (ref 0.7–4.0)
MCHC: 33.1 g/dL (ref 30.0–36.0)
MCV: 85 fl (ref 78.0–100.0)
Monocytes Absolute: 0.4 10*3/uL (ref 0.1–1.0)
Monocytes Relative: 5.4 % (ref 3.0–12.0)
Neutro Abs: 4.3 10*3/uL (ref 1.4–7.7)
Neutrophils Relative %: 65.3 % (ref 43.0–77.0)
Platelets: 215 10*3/uL (ref 150.0–400.0)
RBC: 4.25 Mil/uL (ref 3.87–5.11)
RDW: 14.7 % (ref 11.5–15.5)
WBC: 6.6 10*3/uL (ref 4.0–10.5)

## 2021-03-18 LAB — LIPID PANEL
Cholesterol: 152 mg/dL (ref 0–200)
HDL: 39 mg/dL — ABNORMAL LOW (ref >39.00)
LDL Cholesterol: 90 mg/dL (ref 0–99)
NonHDL: 113.21
Total CHOL/HDL Ratio: 4
Triglycerides: 114 mg/dL (ref 0.0–149.0)
VLDL: 22.8 mg/dL (ref 0.0–40.0)

## 2021-03-18 LAB — COMPREHENSIVE METABOLIC PANEL
ALT: 8 U/L (ref 0–35)
AST: 8 U/L (ref 0–37)
Albumin: 4.2 g/dL (ref 3.5–5.2)
Alkaline Phosphatase: 46 U/L (ref 39–117)
BUN: 13 mg/dL (ref 6–23)
CO2: 25 mEq/L (ref 19–32)
Calcium: 8.9 mg/dL (ref 8.4–10.5)
Chloride: 104 mEq/L (ref 96–112)
Creatinine, Ser: 0.85 mg/dL (ref 0.40–1.20)
GFR: 86.9 mL/min (ref >60.00)
Glucose, Bld: 80 mg/dL (ref 70–99)
Potassium: 4.3 mEq/L (ref 3.5–5.1)
Sodium: 138 mEq/L (ref 135–145)
Total Bilirubin: 0.4 mg/dL (ref 0.2–1.2)
Total Protein: 6.9 g/dL (ref 6.0–8.3)

## 2021-03-18 LAB — TSH: TSH: 2.69 u[IU]/mL (ref 0.35–5.50)

## 2021-03-18 LAB — HEMOGLOBIN A1C: Hgb A1c MFr Bld: 5.7 % (ref 4.6–6.5)

## 2021-03-18 NOTE — Telephone Encounter (Signed)
Pt is asking for more affordable alternative to trulicity as the year end deductible is high, can you advise alternative please

## 2021-03-20 DIAGNOSIS — F411 Generalized anxiety disorder: Secondary | ICD-10-CM | POA: Diagnosis not present

## 2021-05-04 ENCOUNTER — Other Ambulatory Visit: Payer: Self-pay

## 2021-05-04 ENCOUNTER — Ambulatory Visit
Admission: RE | Admit: 2021-05-04 | Discharge: 2021-05-04 | Disposition: A | Discharge status: Home / Self Care | Payer: BC Managed Care – PPO | Source: Ambulatory Visit | Attending: Student | Admitting: Student

## 2021-05-04 VITALS — BP 112/78 | HR 91 | Temp 98.1°F | Resp 16

## 2021-05-04 DIAGNOSIS — E282 Polycystic ovarian syndrome: Secondary | ICD-10-CM | POA: Insufficient documentation

## 2021-05-04 DIAGNOSIS — Z975 Presence of (intrauterine) contraceptive device: Secondary | ICD-10-CM | POA: Diagnosis not present

## 2021-05-04 DIAGNOSIS — N760 Acute vaginitis: Secondary | ICD-10-CM | POA: Diagnosis not present

## 2021-05-04 LAB — POCT URINALYSIS DIP (MANUAL ENTRY)
Bilirubin, UA: NEGATIVE
Blood, UA: NEGATIVE
Glucose, UA: NEGATIVE mg/dL
Nitrite, UA: NEGATIVE
Protein Ur, POC: NEGATIVE mg/dL
Spec Grav, UA: 1.025 (ref 1.010–1.025)
Urobilinogen, UA: 0.2 E.U./dL
pH, UA: 6 (ref 5.0–8.0)

## 2021-05-04 MED ORDER — METRONIDAZOLE 500 MG PO TABS: 500.0000 mg | ORAL_TABLET | Freq: Two times a day (BID) | ORAL | 0 refills | Status: DC

## 2021-05-04 NOTE — ED Provider Notes (Signed)
[facility]    CSN: [id number] Arrival date & time: 05/04/21  1249      History   Chief Complaint Chief Complaint  Patient presents with   Other    Appointment 1300   Dysuria   Vaginal Discharge    HPI Lisa Howe is a 41 y.o. female presenting with vaginitis for 3 days.  Medical history HSV, recurrent BV, IUD contraception.  Describes dysuria, frequency, urgency, grayish vaginal discharge.  External vaginal irritation. Symptoms consistent with past BV. States ever since she got her IUD 4 years ago she's dealt with BV 2-3x yearly. Denies abdominal pain, flank pain, fever/chills, hematuria. Denies STI risk.  HPI  Past Medical History:  Diagnosis Date   Anxiety    panic attacks   Depression    PP anxiety and depression   Excessive daytime sleepiness    Gestational diabetes    HSV-1 (herpes simplex virus 1) infection    IBS (irritable bowel syndrome)    not sure what caused it   Migraines    Obesity (BMI 30-39.9) 01/11/2019   OCD (obsessive compulsive disorder)    PCOS (polycystic ovarian syndrome)    Vaginal Pap smear, abnormal     Patient Active Problem List   Diagnosis Date Noted   PCOS (polycystic ovarian syndrome) 01/11/2019   Obesity (BMI 30-39.9) 01/11/2019   Diabetes mellitus ([place]) 12/27/2018   Hypothyroidism 12/27/2018   Irritable bowel syndrome 12/27/2018   Indication for care in labor or delivery 02/18/2017   Pregnancy 09/21/2015   Amenorrhea due to Depo Provera 03/15/2014   Elevated cholesterol 07/21/2013    Class: History of   History of migraine headaches 07/21/2013   Chronic daily headache 07/22/2011   Eczema 07/22/2011   Migraine headache 07/22/2011   Vitamin D deficiency 07/22/2011    Past Surgical History:  Procedure Laterality Date   TONSILLECTOMY     wisdom teeth       OB History     Gravida  2   Para  2   Term  2   Preterm  0   AB  0   Living  2      SAB  0   IAB  0   Ectopic  0   Multiple       Live Births  2            Home Medications    Prior to Admission medications   Medication Sig Start Date End Date Taking? Authorizing Provider  ALPRAZolam Duanne Moron) 0.5 MG tablet Take 0.5 mg by mouth at bedtime as needed for anxiety.   Yes [provider]  busPIRone (BUSPAR) 10 MG tablet Take 10 mg by mouth 2 (two) times daily as needed. 02/27/20  Yes [provider]  Cholecalciferol (VITAMIN D-3) 125 MCG (5000 UT) TABS Take 2 tablets by mouth daily.   Yes [provider]  metFORMIN (GLUCOPHAGE-XR) 500 MG 24 hr tablet TAKE 2 TABLETS(1000 MG) BY MOUTH TWICE DAILY BEFORE A MEAL 01/13/21  Yes Maximiano Coss, NP  metroNIDAZOLE (FLAGYL) 500 MG tablet Take 1 tablet (500 mg total) by mouth 2 (two) times daily. Avoid alcohol while taking this medication and for 2 days after 05/04/21  Yes Hazel Sams, PA-C  Probiotic Product (FORTIFY PROBIOTIC WOMENS EX ST PO) Take by mouth daily.   Yes [provider]  rizatriptan (MAXALT) 10 MG tablet Take 10 mg by mouth daily as needed. 01/23/20  Yes [provider]  sertraline (ZOLOFT)  100 MG tablet Take 200 mg by mouth daily.   Yes [provider]  spironolactone (ALDACTONE) 50 MG tablet TAKE 1 TABLET(50 MG) BY MOUTH DAILY 01/01/21  Yes Maximiano Coss, NP    Family History Family History  Problem Relation Age of Onset   Hypertension Mother    Hyperlipidemia Mother    Thyroid disease Mother    Hypertension Father    Hyperlipidemia Father    Pulmonary fibrosis Father    Hyperlipidemia Sister    Cancer Maternal Grandmother        melanoma   Parkinson's disease Maternal Grandfather    Heart disease Maternal Grandfather    Diabetes Maternal Grandfather    Cancer Paternal Grandmother        liver   Diabetes Paternal Grandmother    Heart disease Paternal Grandfather     Social History Social History   Tobacco Use   Smoking status: Former    Types: Cigarettes   Smokeless tobacco: Never    Tobacco comments:    3-5 cigarettes a day  Vaping Use   Vaping Use: Never used  Substance Use Topics   Alcohol use: Yes    Comment: occasionally   Drug use: No     Allergies   Advil [ibuprofen], Vancomycin, Amoxicillin, and Penicillins   Review of Systems Review of Systems  Constitutional:  Negative for chills and fever.  HENT:  Negative for sore throat.   Eyes:  Negative for pain and redness.  Respiratory:  Negative for shortness of breath.   Cardiovascular:  Negative for chest pain.  Gastrointestinal:  Negative for abdominal pain, diarrhea, nausea and vomiting.  Genitourinary:  Positive for dysuria, frequency and vaginal discharge. Negative for decreased urine volume, difficulty urinating, flank pain, genital sores, hematuria and urgency.  Musculoskeletal:  Negative for back pain.  Skin:  Negative for rash.  All other systems reviewed and are negative.   Physical Exam Triage Vital Signs ED Triage Vitals [05/04/21 1307]  Enc Vitals Group     BP 112/78     Pulse Rate 91     Resp 16     Temp 98.1 F (36.7 C)     Temp Source Oral     SpO2 97 %     Weight      Height      Head Circumference      Peak Flow      Pain Score 5     Pain Loc      Pain Edu?      Excl. in [place]?    No data found.  Updated Vital Signs BP 112/78    Pulse 91    Temp 98.1 F (36.7 C) (Oral)    Resp 16    SpO2 97%    Breastfeeding No   Visual Acuity Right Eye Distance:   Left Eye Distance:   Bilateral Distance:    Right Eye Near:   Left Eye Near:    Bilateral Near:     Physical Exam Vitals reviewed.  Constitutional:      General: She is not in acute distress.    Appearance: Normal appearance. She is not ill-appearing.  HENT:     Head: Normocephalic and atraumatic.     Mouth/Throat:     Mouth: Mucous membranes are moist.     Comments: Moist mucous membranes Eyes:     Extraocular Movements: Extraocular movements intact.     Pupils: Pupils are equal, round, and reactive to light.  Cardiovascular:     Rate and Rhythm: Normal rate and regular rhythm.     Heart sounds: Normal heart sounds.  Pulmonary:     Effort: Pulmonary effort is normal.     Breath sounds: Normal breath sounds. No wheezing, rhonchi or rales.  Abdominal:     General: Bowel sounds are normal. There is no distension.     Palpations: Abdomen is soft. There is no mass.     Tenderness: There is no abdominal tenderness. There is no right CVA tenderness, left CVA tenderness, guarding or rebound.  Genitourinary:    Comments: deferred Skin:    General: Skin is warm.     Capillary Refill: Capillary refill takes less than 2 seconds.     Comments: Good skin turgor  Neurological:     General: No focal deficit present.     Mental Status: She is alert and oriented to person, place, and time.  Psychiatric:        Mood and Affect: Mood normal.        Behavior: Behavior normal.     UC Treatments / Results  Labs (all labs ordered are listed, but only abnormal results are displayed) Labs Reviewed  POCT URINALYSIS DIP (MANUAL ENTRY) - Abnormal; Notable for the following components:      Result Value   Ketones, POC UA small (15) (*)    Leukocytes, UA Small (1+) (*)    All other components within normal limits  CERVICOVAGINAL ANCILLARY ONLY    EKG   Radiology No results found.  Procedures Procedures (including critical care time)  Medications Ordered in UC Medications - No data to display  Initial Impression / Assessment and Plan / UC Course  I have reviewed the triage vital signs and the nursing notes.  Pertinent labs & imaging results that were available during my care of the patient were reviewed by me and considered in my medical decision making (see chart for details).     This patient is a very pleasant 41 y.o. year old female presenting with recurrent vaginitis. Afebrile, nontachycardic, no reproducible abd pain or CVAT. History PCOS.  UA with small ketones and small leuk. Did not  send culture. IUD contraception. Denies STI risk. Will send self-swab for G/C, trich, yeast, BV testing. Declines HIV, RPR. Safe sex precautions.   Suspect BV. Flagyl sent as below.   ED return precautions discussed. Patient verbalizes understanding and agreement.     Final Clinical Impressions(s) / UC Diagnoses   Final diagnoses:  Vaginitis and vulvovaginitis  IUD contraception  PCOS (polycystic ovarian syndrome)     Discharge Instructions      -For bacterial vaginosis, start the antibiotic-Flagyl (metronidazole), 2 pills daily for 7 days.  You can take this with food if you have a sensitive stomach.  Avoid alcohol while taking this medication and for 2 days after as this will cause severe nausea and vomiting. -We'll call in 2-3 days if any other abnormal lab results (like yeast infection) -You can discuss IUD removal with your PCP or gyn.     ED Prescriptions     Medication Sig Dispense Auth. Provider   metroNIDAZOLE (FLAGYL) 500 MG tablet Take 1 tablet (500 mg total) by mouth 2 (two) times daily. Avoid alcohol while taking this medication and for 2 days after 14 tablet Hazel Sams, PA-C      PDMP not reviewed this encounter.   Hazel Sams, PA-C 05/04/21 1344

## 2021-05-04 NOTE — ED Triage Notes (Signed)
C/O dysuria, poyuria, urinary urgency onset 3 days ago; started with "grayish" vaginal discharge and tenderness to external genitalia over past week.  Denies fevers.

## 2021-05-04 NOTE — Discharge Instructions (Signed)
-  For bacterial vaginosis, start the antibiotic-Flagyl (metronidazole), 2 pills daily for 7 days.  You can take this with food if you have a sensitive stomach.  Avoid alcohol while taking this medication and for 2 days after as this will cause severe nausea and vomiting. -We'll call in 2-3 days if any other abnormal lab results (like yeast infection) -You can discuss IUD removal with your PCP or gyn.

## 2021-05-05 LAB — CERVICOVAGINAL ANCILLARY ONLY
Bacterial Vaginitis (gardnerella): NEGATIVE
Candida Glabrata: NEGATIVE
Candida Vaginitis: POSITIVE — AB
Chlamydia: NEGATIVE
Comment: NEGATIVE
Comment: NEGATIVE
Comment: NEGATIVE
Comment: NEGATIVE
Comment: NEGATIVE
Comment: NORMAL
Neisseria Gonorrhea: NEGATIVE
Trichomonas: NEGATIVE

## 2021-05-06 ENCOUNTER — Telehealth (HOSPITAL_COMMUNITY): Payer: Self-pay | Admitting: Emergency Medicine

## 2021-05-06 MED ORDER — FLUCONAZOLE 150 MG PO TABS: 150.0000 mg | ORAL_TABLET | Freq: Once | ORAL | 0 refills | Status: AC

## 2021-05-08 ENCOUNTER — Encounter: Payer: Self-pay | Admitting: Registered Nurse

## 2021-05-08 NOTE — Telephone Encounter (Signed)
I have scheduled patient for an OV 05/09/2021

## 2021-05-09 ENCOUNTER — Ambulatory Visit (INDEPENDENT_AMBULATORY_CARE_PROVIDER_SITE_OTHER): Payer: BC Managed Care – PPO | Admitting: Registered Nurse

## 2021-05-09 ENCOUNTER — Other Ambulatory Visit: Payer: Self-pay

## 2021-05-09 ENCOUNTER — Encounter: Payer: Self-pay | Admitting: Registered Nurse

## 2021-05-09 VITALS — BP 115/71 | HR 68 | Temp 98.1°F | Resp 18 | Ht 67.0 in | Wt 238.8 lb

## 2021-05-09 DIAGNOSIS — E1169 Type 2 diabetes mellitus with other specified complication: Secondary | ICD-10-CM | POA: Diagnosis not present

## 2021-05-09 DIAGNOSIS — E282 Polycystic ovarian syndrome: Secondary | ICD-10-CM

## 2021-05-09 DIAGNOSIS — E669 Obesity, unspecified: Secondary | ICD-10-CM | POA: Diagnosis not present

## 2021-05-09 DIAGNOSIS — F411 Generalized anxiety disorder: Secondary | ICD-10-CM

## 2021-05-09 LAB — POCT GLYCOSYLATED HEMOGLOBIN (HGB A1C): Hemoglobin A1C: 5.5 % (ref 4.0–5.6)

## 2021-05-09 NOTE — Patient Instructions (Addendum)
Ms. Carrell -   Doristine Devoid to see you  Use hydrocortisone OTC topically twice daily on lesion on the legs  Try to use coping mechanisms to limit stress  Ok to increase buspar to three times daily.  Follow up with Dr. Ronnald Ramp as scheduled.  Call if you need anything!  Thanks,  Rich     If you have lab work done today you will be contacted with your lab results within the next 2 weeks.  If you have not heard from [place] then please contact us. The fastest way to get your results is to register for My Chart.   IF you received an x-ray today, you will receive an invoice from [facility]. Please contact [facility] at [phone] with questions or concerns regarding your invoice.   IF you received labwork today, you will receive an invoice from [place]. Please contact [facility] at [phone] with questions or concerns regarding your invoice.   Our billing staff will not be able to assist you with questions regarding bills from these companies.  You will be contacted with the lab results as soon as they are available. The fastest way to get your results is to activate your My Chart account. Instructions are located on the last page of this paperwork. If you have not heard from [place] regarding the results in 2 weeks, please contact this office.

## 2021-05-09 NOTE — Progress Notes (Signed)
Established Patient Office Visit  Subjective:  Patient ID: Lisa Howe, female    DOB: [birth year]  Age: 41 y.o. MRN: [id number]  CC:  Chief Complaint  Patient presents with   Medication Problem    Patient states she has been super thirsty, yeast infections, some boils on her legs that means her body is out of wack. Patient states she believe this is because she is off of Trulicity. She would like to check her a1c    HPI Lisa Howe presents for variety of symptoms.  Noted over preceding mos she has had boils on legs, polydipsia, frequent vaginitis Concern that sugars are rising. Has been prediabetic in the past, went on trulicity, corrected.  No other changes to diet, lifestyle, allergen exposure, etc.  Notes new job, a lot of stress.   Has upcoming appt with psychiatry Lisa Howe in 2 weeks. Plans to address her increased reliance on buspar and alprazolam recently.   Past Medical History:  Diagnosis Date   Anxiety    panic attacks   Depression    PP anxiety and depression   Excessive daytime sleepiness    Gestational diabetes    HSV-1 (herpes simplex virus 1) infection    IBS (irritable bowel syndrome)    not sure what caused it   Migraines    Obesity (BMI 30-39.9) 01/11/2019   OCD (obsessive compulsive disorder)    PCOS (polycystic ovarian syndrome)    Vaginal Pap smear, abnormal     Past Surgical History:  Procedure Laterality Date   TONSILLECTOMY     wisdom teeth       Family History  Problem Relation Age of Onset   Hypertension Mother    Hyperlipidemia Mother    Thyroid disease Mother    Hypertension Father    Hyperlipidemia Father    Pulmonary fibrosis Father    Hyperlipidemia Sister    Cancer Maternal Grandmother        melanoma   Parkinson's disease Maternal Grandfather    Heart disease Maternal Grandfather    Diabetes Maternal Grandfather    Cancer Paternal Grandmother        liver   Diabetes Paternal Grandmother    Heart disease  Paternal Grandfather     Social History   Socioeconomic History   Marital status: Married    Spouse name: Not on file   Number of children: Not on file   Years of education: Not on file   Highest education level: Not on file  Occupational History   Not on file  Tobacco Use   Smoking status: Former    Types: Cigarettes   Smokeless tobacco: Never   Tobacco comments:    3-5 cigarettes a day  Vaping Use   Vaping Use: Never used  Substance and Sexual Activity   Alcohol use: Yes    Comment: occasionally   Drug use: No   Sexual activity: Yes    Partners: Male    Birth control/protection: Condom, I.U.D.  Other Topics Concern   Not on file  Social History Narrative   Married 6 years,second.First marriage lasted 7 years.Banker at [employer].   Social Determinants of Health   Financial Resource Strain: Not on file  Food Insecurity: Not on file  Transportation Needs: Not on file  Physical Activity: Not on file  Stress: Not on file  Social Connections: Not on file  Intimate Partner Violence: Not on file    Outpatient Medications Prior to Visit  Medication Sig  Dispense Refill   ALPRAZolam (XANAX) 0.5 MG tablet Take 0.5 mg by mouth at bedtime as needed for anxiety.     busPIRone (BUSPAR) 10 MG tablet Take 10 mg by mouth 2 (two) times daily as needed.     Cholecalciferol (VITAMIN D-3) 125 MCG (5000 UT) TABS Take 2 tablets by mouth daily.     metFORMIN (GLUCOPHAGE-XR) 500 MG 24 hr tablet TAKE 2 TABLETS(1000 MG) BY MOUTH TWICE DAILY BEFORE A MEAL 360 tablet 0   Probiotic Product (FORTIFY PROBIOTIC WOMENS EX ST PO) Take by mouth daily.     rizatriptan (MAXALT) 10 MG tablet Take 10 mg by mouth daily as needed.     sertraline (ZOLOFT) 100 MG tablet Take 200 mg by mouth daily.     spironolactone (ALDACTONE) 50 MG tablet TAKE 1 TABLET(50 MG) BY MOUTH DAILY 90 tablet 1   metroNIDAZOLE (FLAGYL) 500 MG tablet Take 1 tablet (500 mg total) by mouth 2 (two) times daily. Avoid alcohol  while taking this medication and for 2 days after 14 tablet 0   No facility-administered medications prior to visit.    Allergies  Allergen Reactions   Advil [Ibuprofen] Anaphylaxis    Coated / dye    Vancomycin Hives    Hives on chest and back. Tingling around mouth.    Amoxicillin Rash   Penicillins Rash    Has patient had a PCN reaction causing immediate rash, facial/tongue/throat swelling, SOB or lightheadedness with hypotension: Yes Has patient had a PCN reaction causing severe rash involving mucus membranes or skin necrosis: No Has patient had a PCN reaction that required hospitalization No Has patient had a PCN reaction occurring within the last 10 years: No If all of the above answers are "NO", then may proceed with Cephalosporin use.     ROS Review of Systems Per hpi     Objective:    Physical Exam Vitals and nursing note reviewed.  Constitutional:      General: She is not in acute distress.    Appearance: Normal appearance. She is normal weight. She is not ill-appearing, toxic-appearing or diaphoretic.  Cardiovascular:     Rate and Rhythm: Normal rate and regular rhythm.     Heart sounds: Normal heart sounds. No murmur heard.   No friction rub. No gallop.  Pulmonary:     Effort: Pulmonary effort is normal. No respiratory distress.     Breath sounds: Normal breath sounds. No stridor. No wheezing, rhonchi or rales.  Chest:     Chest wall: No tenderness.  Skin:    General: Skin is warm and dry.  Neurological:     General: No focal deficit present.     Mental Status: She is alert and oriented to person, place, and time. Mental status is at baseline.  Psychiatric:        Mood and Affect: Mood normal.        Behavior: Behavior normal.        Thought Content: Thought content normal.        Judgment: Judgment normal.    BP 115/71    Pulse 68    Temp 98.1 F (36.7 C) (Temporal)    Resp 18    Ht 5\' 7"  (1.702 m)    Wt 238 lb 12.8 oz (108.3 kg)    SpO2 100%    BMI  37.40 kg/m  Wt Readings from Last 3 Encounters:  05/09/21 238 lb 12.8 oz (108.3 kg)  01/01/21 249 lb 3.2 oz (113  kg)  08/27/20 254 lb (115.2 kg)     Health Maintenance Due  Topic Date Due   URINE MICROALBUMIN  Never done   PAP SMEAR-Modifier  07/21/2016   COVID-19 Vaccine (4 - Booster for Moderna series) 03/29/2020    There are no preventive care reminders to display for this patient.  Lab Results  Component Value Date   TSH 2.69 03/18/2021   Lab Results  Component Value Date   WBC 6.6 03/18/2021   HGB 12.0 03/18/2021   HCT 36.1 03/18/2021   MCV 85.0 03/18/2021   PLT 215.0 03/18/2021   Lab Results  Component Value Date   NA 138 03/18/2021   K 4.3 03/18/2021   CO2 25 03/18/2021   GLUCOSE 80 03/18/2021   BUN 13 03/18/2021   CREATININE 0.85 03/18/2021   BILITOT 0.4 03/18/2021   ALKPHOS 46 03/18/2021   AST 8 03/18/2021   ALT 8 03/18/2021   PROT 6.9 03/18/2021   ALBUMIN 4.2 03/18/2021   CALCIUM 8.9 03/18/2021   ANIONGAP 7 09/23/2015   GFR 86.90 03/18/2021   Lab Results  Component Value Date   CHOL 152 03/18/2021   Lab Results  Component Value Date   HDL 39.00 (L) 03/18/2021   Lab Results  Component Value Date   LDLCALC 90 03/18/2021   Lab Results  Component Value Date   TRIG 114.0 03/18/2021   Lab Results  Component Value Date   CHOLHDL 4 03/18/2021   Lab Results  Component Value Date   HGBA1C 5.5 05/09/2021      Assessment & Plan:   Problem List Items Addressed This Visit       Endocrine   PCOS (polycystic ovarian syndrome)   Diabetes mellitus ([place]) - Primary   Relevant Orders   POCT glycosylated hemoglobin (Hb A1C) (Completed)     Other   Obesity (BMI 30-39.9)   Other Visit Diagnoses     GAD (generalized anxiety disorder)           No orders of the defined types were placed in this encounter.   Follow-up: Return if symptoms worsen or fail to improve.   PLAN A1c at 5.5, no concerns Suggest hydrocortisone of boil on leg  recurs Suspect increased anxiety leading to symptoms. Increase buspar to 10mg  po tid Patient encouraged to call clinic with any questions, comments, or concerns.  Maximiano Coss, NP

## 2021-05-20 DIAGNOSIS — F411 Generalized anxiety disorder: Secondary | ICD-10-CM | POA: Diagnosis not present

## 2021-06-11 ENCOUNTER — Other Ambulatory Visit: Payer: Self-pay

## 2021-06-11 ENCOUNTER — Other Ambulatory Visit: Payer: Self-pay | Admitting: Registered Nurse

## 2021-06-11 DIAGNOSIS — R7303 Prediabetes: Secondary | ICD-10-CM

## 2021-06-11 MED ORDER — METFORMIN HCL ER 500 MG PO TB24: 500.0000 mg | ORAL_TABLET | Freq: Two times a day (BID) | ORAL | 0 refills | Status: DC

## 2021-06-11 MED ORDER — METFORMIN HCL ER 500 MG PO TB24: ORAL_TABLET | ORAL | 0 refills | Status: DC

## 2021-06-28 DIAGNOSIS — B3731 Acute candidiasis of vulva and vagina: Secondary | ICD-10-CM | POA: Diagnosis not present

## 2021-07-16 DIAGNOSIS — F411 Generalized anxiety disorder: Secondary | ICD-10-CM | POA: Diagnosis not present

## 2021-08-28 ENCOUNTER — Encounter (INDEPENDENT_AMBULATORY_CARE_PROVIDER_SITE_OTHER): Payer: BC Managed Care – PPO | Admitting: Nurse Practitioner

## 2021-09-08 DIAGNOSIS — N760 Acute vaginitis: Secondary | ICD-10-CM | POA: Diagnosis not present

## 2021-09-09 ENCOUNTER — Other Ambulatory Visit: Payer: Self-pay

## 2021-09-09 DIAGNOSIS — R7303 Prediabetes: Secondary | ICD-10-CM

## 2021-09-09 MED ORDER — METFORMIN HCL ER 500 MG PO TB24: ORAL_TABLET | ORAL | 0 refills | Status: DC

## 2021-09-10 ENCOUNTER — Ambulatory Visit: Payer: BC Managed Care – PPO | Admitting: Registered Nurse

## 2021-09-30 ENCOUNTER — Ambulatory Visit (INDEPENDENT_AMBULATORY_CARE_PROVIDER_SITE_OTHER): Payer: BC Managed Care – PPO | Admitting: Registered Nurse

## 2021-09-30 VITALS — BP 110/72 | HR 76 | Temp 98.3°F | Resp 16 | Ht 67.0 in | Wt 253.8 lb

## 2021-09-30 DIAGNOSIS — E1169 Type 2 diabetes mellitus with other specified complication: Secondary | ICD-10-CM

## 2021-09-30 DIAGNOSIS — E282 Polycystic ovarian syndrome: Secondary | ICD-10-CM | POA: Diagnosis not present

## 2021-09-30 DIAGNOSIS — E669 Obesity, unspecified: Secondary | ICD-10-CM

## 2021-09-30 LAB — POCT GLYCOSYLATED HEMOGLOBIN (HGB A1C): Hemoglobin A1C: 5.7 % — AB (ref 4.0–5.6)

## 2021-09-30 MED ORDER — WEGOVY 0.5 MG/0.5ML ~~LOC~~ SOAJ: 0.5000 mg | SUBCUTANEOUS | 0 refills | Status: DC

## 2021-09-30 MED ORDER — SPIRONOLACTONE 50 MG PO TABS: ORAL_TABLET | ORAL | 1 refills | Status: DC

## 2021-10-04 DIAGNOSIS — E119 Type 2 diabetes mellitus without complications: Secondary | ICD-10-CM | POA: Diagnosis not present

## 2021-10-15 DIAGNOSIS — F411 Generalized anxiety disorder: Secondary | ICD-10-CM | POA: Diagnosis not present

## 2021-10-31 DIAGNOSIS — N760 Acute vaginitis: Secondary | ICD-10-CM | POA: Diagnosis not present

## 2021-11-04 DIAGNOSIS — E119 Type 2 diabetes mellitus without complications: Secondary | ICD-10-CM | POA: Diagnosis not present

## 2021-11-24 DIAGNOSIS — Z6839 Body mass index (BMI) 39.0-39.9, adult: Secondary | ICD-10-CM | POA: Diagnosis not present

## 2021-11-24 DIAGNOSIS — Z01419 Encounter for gynecological examination (general) (routine) without abnormal findings: Secondary | ICD-10-CM | POA: Diagnosis not present

## 2021-12-05 DIAGNOSIS — E119 Type 2 diabetes mellitus without complications: Secondary | ICD-10-CM | POA: Diagnosis not present

## 2022-01-04 DIAGNOSIS — E119 Type 2 diabetes mellitus without complications: Secondary | ICD-10-CM | POA: Diagnosis not present

## 2022-01-08 DIAGNOSIS — R1032 Left lower quadrant pain: Secondary | ICD-10-CM | POA: Diagnosis not present

## 2022-01-21 DIAGNOSIS — F411 Generalized anxiety disorder: Secondary | ICD-10-CM | POA: Diagnosis not present

## 2022-01-27 ENCOUNTER — Other Ambulatory Visit: Payer: Self-pay

## 2022-01-27 DIAGNOSIS — R7303 Prediabetes: Secondary | ICD-10-CM

## 2022-01-27 MED ORDER — METFORMIN HCL ER 500 MG PO TB24: ORAL_TABLET | ORAL | 0 refills | Status: DC

## 2022-02-04 ENCOUNTER — Telehealth: Payer: BC Managed Care – PPO | Admitting: Physician Assistant

## 2022-02-04 DIAGNOSIS — H66001 Acute suppurative otitis media without spontaneous rupture of ear drum, right ear: Secondary | ICD-10-CM | POA: Diagnosis not present

## 2022-02-04 DIAGNOSIS — E119 Type 2 diabetes mellitus without complications: Secondary | ICD-10-CM | POA: Diagnosis not present

## 2022-02-04 MED ORDER — AZITHROMYCIN 250 MG PO TABS: ORAL_TABLET | ORAL | 0 refills | Status: AC

## 2022-02-04 NOTE — Patient Instructions (Signed)
Lisa Howe, thank you for joining [name], PA-C for today's virtual visit.  While this provider is not your primary care provider (PCP), if your PCP is located in our provider database this encounter information will be shared with them immediately following your visit.   [facility] account gives you access to today's visit and all your visits, tests, and labs performed at [facility] " click here if you don't have a [facility] account or go to [email or web address]  Consent: (Patient) Lisa Howe provided verbal consent for this virtual visit at the beginning of the encounter.  Current Medications:  Current Outpatient Medications:    ALPRAZolam (XANAX) 0.5 MG tablet, Take 0.5 mg by mouth at bedtime as needed for anxiety., Disp: , Rfl:    busPIRone (BUSPAR) 10 MG tablet, Take 10 mg by mouth 2 (two) times daily as needed., Disp: , Rfl:    Cholecalciferol (VITAMIN D-3) 125 MCG (5000 UT) TABS, Take 2 tablets by mouth daily., Disp: , Rfl:    metFORMIN (GLUCOPHAGE-XR) 500 MG 24 hr tablet, TAKE 2 TABLETS(1000 MG) BY MOUTH TWICE DAILY BEFORE A MEAL, Disp: 360 tablet, Rfl: 0   Probiotic Product (FORTIFY PROBIOTIC WOMENS EX ST PO), Take by mouth daily., Disp: , Rfl:    rizatriptan (MAXALT) 10 MG tablet, Take 10 mg by mouth daily as needed., Disp: , Rfl:    Semaglutide-Weight Management (WEGOVY) 0.5 MG/0.5ML SOAJ, Inject 0.5 mg into the skin once a week., Disp: 6 mL, Rfl: 0   sertraline (ZOLOFT) 100 MG tablet, Take 200 mg by mouth daily., Disp: , Rfl:    spironolactone (ALDACTONE) 50 MG tablet, TAKE 1 TABLET(50 MG) BY MOUTH DAILY, Disp: 90 tablet, Rfl: 1   Medications ordered in this encounter:  No orders of the defined types were placed in this encounter.    *If you need refills on other medications prior to your next appointment, please contact your pharmacy*  Follow-Up: Call back or seek an in-person evaluation if the symptoms worsen or if  the condition fails to improve as anticipated.  [place] [phone]  Other Instructions Otitis Media, Adult  Otitis media is a condition in which the middle ear is red and swollen (inflamed) and full of fluid. The middle ear is the part of the ear that contains bones for hearing as well as air that helps send sounds to the brain. The condition usually goes away on its own. What are the causes? This condition is caused by a blockage in the eustachian tube. This tube connects the middle ear to the back of the nose. It normally allows air into the middle ear. The blockage is caused by fluid or swelling. Problems that can cause blockage include: A cold or infection that affects the nose, mouth, or throat. Allergies. An irritant, such as tobacco smoke. Adenoids that have become large. The adenoids are soft tissue located in the back of the throat, behind the nose and the roof of the mouth. Growth or swelling in the upper part of the throat, just behind the nose (nasopharynx). Damage to the ear caused by a change in pressure. This is called barotrauma. What increases the risk? You are more likely to develop this condition if you: Smoke or are exposed to tobacco smoke. Have an opening in the roof of your mouth (cleft palate). Have acid reflux. Have problems in your body's defense system (immune system). What are the signs or symptoms? Symptoms of this  condition include: Ear pain. Fever. Problems with hearing. Being tired. Fluid leaking from the ear. Ringing in the ear. How is this treated? This condition can go away on its own within 3-5 days. But if the condition is caused by germs (bacteria) and does not go away on its own, or if it keeps coming back, your doctor may: Give you antibiotic medicines. Give you medicines for pain. Follow these instructions at home: Take over-the-counter and prescription medicines only as told by your doctor. If you were prescribed an  antibiotic medicine, take it as told by your doctor. Do not stop taking it even if you start to feel better. Keep all follow-up visits. Contact a doctor if: You have bleeding from your nose. There is a lump on your neck. You are not feeling better in 5 days. You feel worse instead of better. Get help right away if: You have pain that is not helped with medicine. You have swelling, redness, or pain around your ear. You get a stiff neck. You cannot move part of your face (paralysis). You notice that the bone behind your ear hurts when you touch it. You get a very bad headache. Summary Otitis media means that the middle ear is red, swollen, and full of fluid. This condition usually goes away on its own. If the problem does not go away, treatment may be needed. You may be given medicines to treat the infection or to treat your pain. If you were prescribed an antibiotic medicine, take it as told by your doctor. Do not stop taking it even if you start to feel better. Keep all follow-up visits. This information is not intended to replace advice given to you by your health care provider. Make sure you discuss any questions you have with your health care provider. Document Revised: 07/01/2020 Document Reviewed: 07/01/2020 Elsevier Patient Education  [place].    If you have been instructed to have an in-person evaluation today at a local Urgent Care facility, please use the link below. It will take you to a list of all of our available [facility], including address, phone number and hours of operation. Please do not delay care.  [facility]  If you or a family member do not have a primary care provider, use the link below to schedule a visit and establish care. When you choose a [facility] primary care physician or advanced practice provider, you gain a long-term partner in health. Find a Primary Care Provider  Learn more about [facility]'s in-office and  virtual care options: [facility]

## 2022-02-04 NOTE — Progress Notes (Signed)
Virtual Visit Consent   Lisa Howe, you are scheduled for a virtual visit with a [place] provider today. Just as with appointments in the office, your consent must be obtained to participate. Your consent will be active for this visit and any virtual visit you may have with one of our providers in the next 365 days. If you have a MyChart account, a copy of this consent can be sent to you electronically.  As this is a virtual visit, video technology does not allow for your provider to perform a traditional examination. This may limit your provider's ability to fully assess your condition. If your provider identifies any concerns that need to be evaluated in person or the need to arrange testing (such as labs, EKG, etc.), we will make arrangements to do so. Although advances in technology are sophisticated, we cannot ensure that it will always work on either your end or our end. If the connection with a video visit is poor, the visit may have to be switched to a telephone visit. With either a video or telephone visit, we are not always able to ensure that we have a secure connection.  By engaging in this virtual visit, you consent to the provision of healthcare and authorize for your insurance to be billed (if applicable) for the services provided during this visit. Depending on your insurance coverage, you may receive a charge related to this service.  I need to obtain your verbal consent now. Are you willing to proceed with your visit today? Lisa Howe has provided verbal consent on 02/04/2022 for a virtual visit (video or telephone). Lisa Howe, [place]  Date: 02/04/2022 9:30 AM  Virtual Visit via Video Note   I, Lisa Howe, connected with  ANOKHI SHANNON  ([id number], Jan 14, 1981) on 02/04/22 at  9:00 AM EDT by a video-enabled telemedicine application and verified that I am speaking with the correct person using two identifiers.  Location: Patient: Virtual Visit Location  Patient: Home Provider: Virtual Visit Location Provider: Home Office   I discussed the limitations of evaluation and management by telemedicine and the availability of in person appointments. The patient expressed understanding and agreed to proceed.    History of Present Illness: Lisa Howe is a 41 y.o. who identifies as a female who was assigned female at birth, and is being seen today for few days of R ear pressure and pain, now with tenderness wiggling ear lobe and some tender lymph nodes adjacent to ear. Denies fever, chills. Had some mild URI symptoms that are resolved. Is prone to ear infections as an adult ever since she gave birth to her children.   HPI: HPI  Problems:  Patient Active Problem List   Diagnosis Date Noted   PCOS (polycystic ovarian syndrome) 01/11/2019   Obesity (BMI 30-39.9) 01/11/2019   Diabetes mellitus ([place]) 12/27/2018   Hypothyroidism 12/27/2018   Irritable bowel syndrome 12/27/2018   Indication for care in labor or delivery 02/18/2017   Pregnancy 09/21/2015   Amenorrhea due to Depo Provera 03/15/2014   Elevated cholesterol 07/21/2013    Class: History of   History of migraine headaches 07/21/2013   Chronic daily headache 07/22/2011   Eczema 07/22/2011   Migraine headache 07/22/2011   Vitamin D deficiency 07/22/2011    Allergies:  Allergies  Allergen Reactions   Advil [Ibuprofen] Anaphylaxis    Coated / dye    Vancomycin Hives    Hives on chest and back. Tingling around mouth.  Amoxicillin Rash   Penicillins Rash    Has patient had a PCN reaction causing immediate rash, facial/tongue/throat swelling, SOB or lightheadedness with hypotension: Yes Has patient had a PCN reaction causing severe rash involving mucus membranes or skin necrosis: No Has patient had a PCN reaction that required hospitalization No Has patient had a PCN reaction occurring within the last 10 years: No If all of the above answers are "NO", then may proceed with  Cephalosporin use.    Medications:  Current Outpatient Medications:    azithromycin (ZITHROMAX) 250 MG tablet, Take 2 tablets on day 1, then 1 tablet daily on days 2 through 5, Disp: 6 tablet, Rfl: 0   ALPRAZolam (XANAX) 0.5 MG tablet, Take 0.5 mg by mouth at bedtime as needed for anxiety., Disp: , Rfl:    busPIRone (BUSPAR) 10 MG tablet, Take 10 mg by mouth 2 (two) times daily as needed., Disp: , Rfl:    Cholecalciferol (VITAMIN D-3) 125 MCG (5000 UT) TABS, Take 2 tablets by mouth daily., Disp: , Rfl:    Magnesium 100 MG CAPS, Magnesium, Disp: , Rfl:    metFORMIN (GLUCOPHAGE-XR) 500 MG 24 hr tablet, TAKE 2 TABLETS(1000 MG) BY MOUTH TWICE DAILY BEFORE A MEAL, Disp: 360 tablet, Rfl: 0   Probiotic Product (FORTIFY PROBIOTIC WOMENS EX ST PO), Take by mouth daily., Disp: , Rfl:    rizatriptan (MAXALT) 10 MG tablet, Take 10 mg by mouth daily as needed., Disp: , Rfl:    Semaglutide-Weight Management (WEGOVY) 0.5 MG/0.5ML SOAJ, Inject 0.5 mg into the skin once a week., Disp: 6 mL, Rfl: 0   sertraline (ZOLOFT) 100 MG tablet, Take 200 mg by mouth daily., Disp: , Rfl:    spironolactone (ALDACTONE) 50 MG tablet, TAKE 1 TABLET(50 MG) BY MOUTH DAILY, Disp: 90 tablet, Rfl: 1  Observations/Objective: Patient is well-developed, well-nourished in no acute distress.  Resting comfortably at home.  Head is normocephalic, atraumatic.  No labored breathing. Speech is clear and coherent with logical content.  Patient is alert and oriented at baseline.   Assessment and Plan: 1. Non-recurrent acute suppurative otitis media of right ear without spontaneous rupture of tympanic membrane - azithromycin (ZITHROMAX) 250 MG tablet; Take 2 tablets on day 1, then 1 tablet daily on days 2 through 5  Dispense: 6 tablet; Refill: 0  Supportive measures and OTC medications reviewed. Resume Flonase. Start Azithromycin as directed.   Follow Up Instructions: I discussed the assessment and treatment plan with the patient. The  patient was provided an opportunity to ask questions and all were answered. The patient agreed with the plan and demonstrated an understanding of the instructions.  A copy of instructions were sent to the patient via MyChart unless otherwise noted below.   The patient was advised to call back or seek an in-person evaluation if the symptoms worsen or if the condition fails to improve as anticipated.  Time:  I spent 10 minutes with the patient via telehealth technology discussing the above problems/concerns.    Lisa Rio, PA-C

## 2022-02-09 ENCOUNTER — Other Ambulatory Visit (HOSPITAL_COMMUNITY): Payer: Self-pay

## 2022-02-09 ENCOUNTER — Telehealth: Payer: Self-pay

## 2022-02-09 NOTE — Telephone Encounter (Signed)
Called pt and informed her of the decision

## 2022-02-09 NOTE — Telephone Encounter (Signed)
Pharmacy Patient Advocate Encounter  Insurance verification completed.    The patient is insured through [employer] for: [employer] 0.5MG/0.5ML auth-injectors.  Pharmacy benefit copay: $1,299.40  Insurance will only allow max quantity of 2 over 180 days. Patient has not met deductible (over $4,500 remaining)

## 2022-02-10 ENCOUNTER — Other Ambulatory Visit (HOSPITAL_COMMUNITY): Payer: Self-pay

## 2022-03-06 DIAGNOSIS — E119 Type 2 diabetes mellitus without complications: Secondary | ICD-10-CM | POA: Diagnosis not present

## 2022-04-06 DIAGNOSIS — E119 Type 2 diabetes mellitus without complications: Secondary | ICD-10-CM | POA: Diagnosis not present

## 2022-04-16 DIAGNOSIS — F411 Generalized anxiety disorder: Secondary | ICD-10-CM | POA: Diagnosis not present

## 2022-04-27 DIAGNOSIS — J069 Acute upper respiratory infection, unspecified: Secondary | ICD-10-CM | POA: Diagnosis not present

## 2022-04-29 ENCOUNTER — Ambulatory Visit (INDEPENDENT_AMBULATORY_CARE_PROVIDER_SITE_OTHER): Payer: BC Managed Care – PPO | Admitting: Family Medicine

## 2022-04-29 ENCOUNTER — Encounter: Payer: Self-pay | Admitting: Family Medicine

## 2022-04-29 VITALS — BP 128/80 | HR 97 | Temp 98.5°F | Ht 67.0 in | Wt 250.2 lb

## 2022-04-29 DIAGNOSIS — U071 COVID-19: Secondary | ICD-10-CM | POA: Diagnosis not present

## 2022-04-29 DIAGNOSIS — R051 Acute cough: Secondary | ICD-10-CM | POA: Diagnosis not present

## 2022-04-29 LAB — POC COVID19 BINAXNOW: SARS Coronavirus 2 Ag: POSITIVE — AB

## 2022-04-29 NOTE — Patient Instructions (Signed)
Sorry to hear that you are sick.  It sounds like you probably had another virus that was started to improve and then a secondary infection with COVID recently.  Based on your cough worsening Monday, you would be a day 2 today.  Okay to continue the same cough medicines for now, fluids, rest.  If you need something stronger for cough, please let me know.  If Lavella Lemons is or not helpful, would not use those any further.  Make sure to not use Afrin more than 3 days in a row.  If any shortness of breath, chest pain, confusion, or acute worsening be seen through urgent care or ER.  Let me know if I can help further and hope you feel better soon!  Your employer may have specific requirements for return to work, but this information is provided from the CDC. There is a link provided below for more information if needed.   Everyone who has presumed or confirmed COVID-19 should stay home and isolate from other people for at least 5 full days (day 0 is the first day of symptoms or the date of the day of the positive viral test for asymptomatic persons). You can end isolation after 5 full days if you are fever-free for 24 hours without the use of fever-reducing medication and your other symptoms have improved (Loss of taste and smell may persist for weeks or months after recovery and need not delay the end of isolation). You should continue to wear a well-fitting mask around others at home and in public for 5 additional days (day 6 through day 10) after the end of your 5-day isolation period. If you are unable to wear a mask when around others, you should continue to isolate for a full 10 days. Avoid people who have weakened immune systems or are more likely to get very sick from COVID-19, and nursing homes and other high-risk settings, until after at least 10 days.  If you continue to have fever or your other symptoms have not improved after 5 days of isolation, you should wait to end your isolation until you are  fever-free for 24 hours without the use of fever-reducing medication and your other symptoms have improved. Continue to wear a well-fitting mask through day 10.   https://brown.org/.html

## 2022-04-29 NOTE — Progress Notes (Signed)
Subjective:  Patient ID: Lisa Howe, female    DOB: [birth year]  Age: 42 y.o. MRN: [id number]  CC:  Chief Complaint  Patient presents with   Fever    Notes cough, now causing soreness to the chest due to constant coughing, tried teledoc on Monday called in cough med tessalon reports was ineffective. Notes then had fever last night, denies having tested for covid     HPI Lisa Howe presents for   Cough Cough past few weeks. Almost gone, then worse 2 nights ago.  As above, recent visit on Monday, Tessalon Perles were ordered, minimal effectiveness - not helpful.  Fever last night, sore from coughing.  Husband sick at same time. Tx: delsym, sudafed, afrin - 2 days. Delsym is helpful.   2 covid vaccines and 1 booster. None recent.   Drinking fluids, no chest pain unless coughing, no dyspnea.   Declines antiviral.   Covid infection few years ago.    History Patient Active Problem List   Diagnosis Date Noted   PCOS (polycystic ovarian syndrome) 01/11/2019   Obesity (BMI 30-39.9) 01/11/2019   Diabetes mellitus ([place]) 12/27/2018   Hypothyroidism 12/27/2018   Irritable bowel syndrome 12/27/2018   Indication for care in labor or delivery 02/18/2017   Pregnancy 09/21/2015   Amenorrhea due to Depo Provera 03/15/2014   Elevated cholesterol 07/21/2013    Class: History of   History of migraine headaches 07/21/2013   Chronic daily headache 07/22/2011   Eczema 07/22/2011   Migraine headache 07/22/2011   Vitamin D deficiency 07/22/2011   Past Medical History:  Diagnosis Date   Anxiety    panic attacks   Depression    PP anxiety and depression   Excessive daytime sleepiness    Gestational diabetes    HSV-1 (herpes simplex virus 1) infection    IBS (irritable bowel syndrome)    not sure what caused it   Migraines    Obesity (BMI 30-39.9) 01/11/2019   OCD (obsessive compulsive disorder)    PCOS (polycystic ovarian syndrome)    Vaginal Pap smear, abnormal    Past  Surgical History:  Procedure Laterality Date   TONSILLECTOMY     wisdom teeth      Allergies  Allergen Reactions   Advil [Ibuprofen] Anaphylaxis    Coated / dye    Vancomycin Hives    Hives on chest and back. Tingling around mouth.    Amoxicillin Rash   Penicillins Rash    Has patient had a PCN reaction causing immediate rash, facial/tongue/throat swelling, SOB or lightheadedness with hypotension: Yes Has patient had a PCN reaction causing severe rash involving mucus membranes or skin necrosis: No Has patient had a PCN reaction that required hospitalization No Has patient had a PCN reaction occurring within the last 10 years: No If all of the above answers are "NO", then may proceed with Cephalosporin use.    Prior to Admission medications   Medication Sig Start Date End Date Taking? Authorizing Provider  ALPRAZolam Duanne Moron) 0.5 MG tablet Take 1 mg by mouth at bedtime as needed for anxiety.   Yes [provider]  busPIRone (BUSPAR) 10 MG tablet Take 10 mg by mouth 2 (two) times daily as needed. 02/27/20  Yes [provider]  Cholecalciferol (VITAMIN D-3) 125 MCG (5000 UT) TABS Take 2 tablets by mouth daily.   Yes [provider]  Magnesium 100 MG CAPS Magnesium   Yes [provider]  metFORMIN (GLUCOPHAGE-XR) 500 MG 24 hr  tablet TAKE 2 TABLETS(1000 MG) BY MOUTH TWICE DAILY BEFORE A MEAL 01/27/22  Yes Midge Minium, MD  Probiotic Product (FORTIFY PROBIOTIC WOMENS EX ST PO) Take by mouth daily.   Yes [provider]  rizatriptan (MAXALT) 10 MG tablet Take 10 mg by mouth daily as needed. 01/23/20  Yes [provider]  sertraline (ZOLOFT) 100 MG tablet Take 200 mg by mouth daily.   Yes [provider]  spironolactone (ALDACTONE) 50 MG tablet TAKE 1 TABLET(50 MG) BY MOUTH DAILY 09/30/21  Yes Maximiano Coss, NP  Cetirizine HCl (ZYRTEC ALLERGY) 10 MG CAPS ZyrTEC    [provider]   Social History   Socioeconomic  History   Marital status: Married    Spouse name: Not on file   Number of children: Not on file   Years of education: Not on file   Highest education level: Not on file  Occupational History   Not on file  Tobacco Use   Smoking status: Former    Types: Cigarettes   Smokeless tobacco: Never   Tobacco comments:    3-5 cigarettes a day  Vaping Use   Vaping Use: Never used  Substance and Sexual Activity   Alcohol use: Yes    Comment: occasionally   Drug use: No   Sexual activity: Yes    Partners: Male    Birth control/protection: Condom, I.U.D.  Other Topics Concern   Not on file  Social History Narrative   Married 6 years,second.First marriage lasted 7 years.Banker at [employer].   Social Determinants of Health   Financial Resource Strain: Not on file  Food Insecurity: Not on file  Transportation Needs: Not on file  Physical Activity: Not on file  Stress: Not on file  Social Connections: Not on file  Intimate Partner Violence: Not on file    Review of Systems Per HPI.   Objective:   Vitals:   04/29/22 1403  BP: 128/80  Pulse: 97  Temp: 98.5 F (36.9 C)  TempSrc: Oral  SpO2: 98%  Weight: 250 lb 3.2 oz (113.5 kg)  Height: '5\' 7"'$  (1.702 m)    Physical Exam Vitals reviewed.  Constitutional:      General: She is not in acute distress.    Appearance: She is well-developed.  HENT:     Head: Normocephalic and atraumatic.     Right Ear: Hearing, tympanic membrane, ear canal and external ear normal.     Left Ear: Hearing, tympanic membrane, ear canal and external ear normal.     Nose: Nose normal.     Mouth/Throat:     Pharynx: No posterior oropharyngeal erythema.  Eyes:     Conjunctiva/sclera: Conjunctivae normal.     Pupils: Pupils are equal, round, and reactive to light.  Cardiovascular:     Rate and Rhythm: Normal rate and regular rhythm.     Heart sounds: Normal heart sounds. No murmur heard. Pulmonary:     Effort: Pulmonary effort is normal. No  respiratory distress.     Breath sounds: Normal breath sounds. No wheezing or rhonchi.  Skin:    General: Skin is warm and dry.     Findings: No rash.  Neurological:     Mental Status: She is alert and oriented to person, place, and time.  Psychiatric:        Mood and Affect: Mood normal.        Behavior: Behavior normal.     Results for orders placed or performed in visit  on 04/29/22  POC COVID-19  Result Value Ref Range   SARS Coronavirus 2 Ag Positive (A) Negative     Assessment & Plan:  Lisa Howe is a 42 y.o. female . Acute cough - Plan: POC COVID-19  COVID-19 virus infection Likely initial viral infection,  was improving past few weeks and secondary worsening past 2 to 3 days with positive COVID testing in office.  Likely recent COVID infection.  Antivirals discussed including side effects possible and rebound COVID, declined at this time.  Symptomatic care discussed, overall stable with current home treatments.  Declined further meds at this time.  Masking and isolation precautions discussed as well as ER precautions given.  No orders of the defined types were placed in this encounter.  Patient Instructions  Sorry to hear that you are sick.  It sounds like you probably had another virus that was started to improve and then a secondary infection with COVID recently.  Based on your cough worsening Monday, you would be a day 2 today.  Okay to continue the same cough medicines for now, fluids, rest.  If you need something stronger for cough, please let me know.  If Lavella Lemons is or not helpful, would not use those any further.  Make sure to not use Afrin more than 3 days in a row.  If any shortness of breath, chest pain, confusion, or acute worsening be seen through urgent care or ER.  Let me know if I can help further and hope you feel better soon!  Your employer may have specific requirements for return to work, but this information is provided from the CDC. There is a link  provided below for more information if needed.   Everyone who has presumed or confirmed COVID-19 should stay home and isolate from other people for at least 5 full days (day 0 is the first day of symptoms or the date of the day of the positive viral test for asymptomatic persons). You can end isolation after 5 full days if you are fever-free for 24 hours without the use of fever-reducing medication and your other symptoms have improved (Loss of taste and smell may persist for weeks or months after recovery and need not delay the end of isolation). You should continue to wear a well-fitting mask around others at home and in public for 5 additional days (day 6 through day 10) after the end of your 5-day isolation period. If you are unable to wear a mask when around others, you should continue to isolate for a full 10 days. Avoid people who have weakened immune systems or are more likely to get very sick from COVID-19, and nursing homes and other high-risk settings, until after at least 10 days.  If you continue to have fever or your other symptoms have not improved after 5 days of isolation, you should wait to end your isolation until you are fever-free for 24 hours without the use of fever-reducing medication and your other symptoms have improved. Continue to wear a well-fitting mask through day 10.   https://brown.org/.html     Signed,   Merri Ray, MD [place], [facility] 04/29/22 2:51 PM

## 2022-05-07 DIAGNOSIS — E119 Type 2 diabetes mellitus without complications: Secondary | ICD-10-CM | POA: Diagnosis not present

## 2022-05-13 ENCOUNTER — Other Ambulatory Visit: Payer: Self-pay | Admitting: Family Medicine

## 2022-05-13 DIAGNOSIS — R7303 Prediabetes: Secondary | ICD-10-CM

## 2022-05-14 DIAGNOSIS — F411 Generalized anxiety disorder: Secondary | ICD-10-CM | POA: Diagnosis not present

## 2022-06-04 ENCOUNTER — Encounter: Payer: Self-pay | Admitting: Radiology

## 2022-06-05 DIAGNOSIS — E119 Type 2 diabetes mellitus without complications: Secondary | ICD-10-CM | POA: Diagnosis not present

## 2022-06-11 DIAGNOSIS — K5900 Constipation, unspecified: Secondary | ICD-10-CM | POA: Insufficient documentation

## 2022-06-11 DIAGNOSIS — R141 Gas pain: Secondary | ICD-10-CM | POA: Insufficient documentation

## 2022-06-11 DIAGNOSIS — R1310 Dysphagia, unspecified: Secondary | ICD-10-CM | POA: Insufficient documentation

## 2022-06-17 ENCOUNTER — Encounter: Payer: Self-pay | Admitting: Family Medicine

## 2022-06-17 ENCOUNTER — Ambulatory Visit (INDEPENDENT_AMBULATORY_CARE_PROVIDER_SITE_OTHER): Payer: BC Managed Care – PPO | Admitting: Family Medicine

## 2022-06-17 ENCOUNTER — Telehealth: Payer: Self-pay | Admitting: Family Medicine

## 2022-06-17 ENCOUNTER — Telehealth: Payer: Self-pay

## 2022-06-17 VITALS — BP 110/78 | HR 75 | Temp 98.5°F | Ht 67.0 in | Wt 249.6 lb

## 2022-06-17 DIAGNOSIS — E559 Vitamin D deficiency, unspecified: Secondary | ICD-10-CM

## 2022-06-17 DIAGNOSIS — G43719 Chronic migraine without aura, intractable, without status migrainosus: Secondary | ICD-10-CM

## 2022-06-17 DIAGNOSIS — R809 Proteinuria, unspecified: Secondary | ICD-10-CM

## 2022-06-17 DIAGNOSIS — E1169 Type 2 diabetes mellitus with other specified complication: Secondary | ICD-10-CM | POA: Diagnosis not present

## 2022-06-17 DIAGNOSIS — Z1159 Encounter for screening for other viral diseases: Secondary | ICD-10-CM | POA: Diagnosis not present

## 2022-06-17 DIAGNOSIS — E282 Polycystic ovarian syndrome: Secondary | ICD-10-CM | POA: Diagnosis not present

## 2022-06-17 DIAGNOSIS — Z7689 Persons encountering health services in other specified circumstances: Secondary | ICD-10-CM

## 2022-06-17 DIAGNOSIS — Z13220 Encounter for screening for lipoid disorders: Secondary | ICD-10-CM

## 2022-06-17 DIAGNOSIS — Z0000 Encounter for general adult medical examination without abnormal findings: Secondary | ICD-10-CM

## 2022-06-17 LAB — COMPREHENSIVE METABOLIC PANEL
ALT: 26 U/L (ref 0–35)
AST: 24 U/L (ref 0–37)
Albumin: 4.2 g/dL (ref 3.5–5.2)
Alkaline Phosphatase: 52 U/L (ref 39–117)
BUN: 13 mg/dL (ref 6–23)
CO2: 29 mEq/L (ref 19–32)
Calcium: 9.8 mg/dL (ref 8.4–10.5)
Chloride: 102 mEq/L (ref 96–112)
Creatinine, Ser: 0.89 mg/dL (ref 0.40–1.20)
GFR: 81.52 mL/min (ref >60.00)
Glucose, Bld: 95 mg/dL (ref 70–99)
Potassium: 4.3 mEq/L (ref 3.5–5.1)
Sodium: 137 mEq/L (ref 135–145)
Total Bilirubin: 0.3 mg/dL (ref 0.2–1.2)
Total Protein: 7.5 g/dL (ref 6.0–8.3)

## 2022-06-17 LAB — VITAMIN D 25 HYDROXY (VIT D DEFICIENCY, FRACTURES): VITD: 107.33 ng/mL (ref 30.00–100.00)

## 2022-06-17 LAB — LIPID PANEL
Cholesterol: 184 mg/dL (ref 0–200)
HDL: 46.9 mg/dL (ref >39.00)
LDL Cholesterol: 107 mg/dL — ABNORMAL HIGH (ref 0–99)
NonHDL: 136.69
Total CHOL/HDL Ratio: 4
Triglycerides: 150 mg/dL — ABNORMAL HIGH (ref 0.0–149.0)
VLDL: 30 mg/dL (ref 0.0–40.0)

## 2022-06-17 LAB — HEMOGLOBIN A1C: Hgb A1c MFr Bld: 6.1 % (ref 4.6–6.5)

## 2022-06-17 MED ORDER — SPIRONOLACTONE 50 MG PO TABS: ORAL_TABLET | ORAL | 1 refills | Status: DC

## 2022-06-17 NOTE — Patient Instructions (Signed)
It was a pleasure to meet you and I look forward to taking care of you.  -Ordered labs today. Office will call with results and you can see them in [place]. -Refilled Spironolactone. -Follow up in 6 months.

## 2022-06-17 NOTE — Assessment & Plan Note (Signed)
Continue with Spirolactone. Patient reports medication is effective for PCOS symptoms. Refilled medication.

## 2022-06-17 NOTE — Telephone Encounter (Signed)
Lab called with critical Vit.D 107.33  Provider was notified

## 2022-06-17 NOTE — Telephone Encounter (Signed)
Called pt and left vm to call office about labs.

## 2022-06-17 NOTE — Telephone Encounter (Signed)
Caller name: [facility]   On DPR?: Yes  Call back number: [phone]  Provider they see: Evangeline Gula, NP  Reason for call:They don't give immunizations is there something else you need from them- she is patient there.

## 2022-06-17 NOTE — Progress Notes (Signed)
I left a voicemail for this patient to call about lab results.

## 2022-06-17 NOTE — Progress Notes (Signed)
New Patient Office Visit  Subjective    Patient ID: Lisa Howe, female    DOB: 27-Oct-1980  Age: 42 y.o. MRN: [id number]  CC:  Chief Complaint  Patient presents with   Establish Care    Pt is here today to Est. Care. Pt is FASTING. Ptr eports hx Migraines and does not have Migraines often but at this time she does not have a Nuero. Pt would like to discuss this with with.    HPI Lisa Howe presents to establish care with new provider and chronic management.   Diabetes: Chronic.Patient is taking Metformin '1000mg'$  BID. She rarely monitors her blood sugar except when she feels it is low or extremely elevated. She reports she had a hypoglycemia episode, 59, a few weeks ago. She reports she monitored for a few days afterwards. Also, reports her highest blood sugar was 250. Denies chest pain, SHOB, lightheadedness, dizziness, and headaches. She reports she has had an eye exam with ophthalmologist.   Migraines: Patient has a prescription for Rizatriptan '10mg'$  daily as needed. She reports she has not had to take medication for over 6 months. Previously seen a neurologist, whom initially prescribed Rizatriptan. She reports if her headaches come back, she will keep primary care update with going back to a neurologist.   PCOS: Patient takes Spironolactone '50mg'$  daily for symptoms of PCOS. She reports it has been a few weeks since she has had medication.   Patient is under the care of [facility] with Pauline Good, NP who manages her anxiety and depression. Currently, takes Alprazolam, Buspirone, and Zoloft.   Outpatient Encounter Medications as of 06/17/2022  Medication Sig   ALPRAZolam (XANAX XR) 1 MG 24 hr tablet Take 1 mg by mouth daily. prn   busPIRone (BUSPAR) 10 MG tablet Take 10 mg by mouth 2 (two) times daily as needed.   Cetirizine HCl (ZYRTEC ALLERGY) 10 MG CAPS ZyrTEC   Cholecalciferol (VITAMIN D-3) 125 MCG (5000 UT) TABS Take 2 tablets by mouth daily.   Cyanocobalamin  (B-12) 100 MCG TABS    Magnesium 100 MG CAPS Magnesium   metFORMIN (GLUCOPHAGE-XR) 500 MG 24 hr tablet TAKE 2 TABLETS(1000 MG) BY MOUTH TWICE DAILY BEFORE A MEAL   Probiotic Product (FORTIFY PROBIOTIC WOMENS EX ST PO) Take by mouth daily.   rizatriptan (MAXALT) 10 MG tablet Take 10 mg by mouth daily as needed.   sertraline (ZOLOFT) 100 MG tablet Take 200 mg by mouth daily.   [DISCONTINUED] benzonatate (TESSALON) 200 MG capsule Take 200 mg by mouth 3 (three) times daily.   spironolactone (ALDACTONE) 50 MG tablet TAKE 1 TABLET(50 MG) BY MOUTH DAILY   [DISCONTINUED] ALPRAZolam (XANAX) 0.5 MG tablet Take 1 mg by mouth at bedtime as needed for anxiety. (Patient not taking: Reported on 06/17/2022)   [DISCONTINUED] spironolactone (ALDACTONE) 50 MG tablet TAKE 1 TABLET(50 MG) BY MOUTH DAILY   No facility-administered encounter medications on file as of 06/17/2022.    Past Medical History:  Diagnosis Date   Anxiety    panic attacks   Depression    PP anxiety and depression   Excessive daytime sleepiness    Gestational diabetes    HSV-1 (herpes simplex virus 1) infection    IBS (irritable bowel syndrome)    not sure what caused it   Migraines    Obesity (BMI 30-39.9) 01/11/2019   OCD (obsessive compulsive disorder)    PCOS (polycystic ovarian syndrome)    Vaginal Pap smear, abnormal  Past Surgical History:  Procedure Laterality Date   TONSILLECTOMY     wisdom teeth       Family History  Problem Relation Age of Onset   Diabetes Mother    Hypertension Mother    Hyperlipidemia Mother    Thyroid disease Mother    Pulmonary fibrosis Father    Hypertension Father    Hyperlipidemia Father    Hyperlipidemia Sister    Cancer Maternal Grandmother        melanoma   Parkinson's disease Maternal Grandfather    Heart disease Maternal Grandfather    Diabetes Maternal Grandfather    Cancer Paternal Grandmother        liver   Diabetes Paternal Grandmother    Heart disease Paternal  Grandfather    COPD Paternal Grandfather     Social History   Socioeconomic History   Marital status: Married    Spouse name: Not on file   Number of children: 2   Years of education: Not on file   Highest education level: Associate degree: academic program  Occupational History   Not on file  Tobacco Use   Smoking status: Former    Years: 5.00    Types: Cigarettes    Quit date: 01/2013    Years since quitting: 9.4   Smokeless tobacco: Never   Tobacco comments:    3-5 cigarettes a day  Vaping Use   Vaping Use: Never used  Substance and Sexual Activity   Alcohol use: Yes    Comment: Once every 3 months.   Drug use: No   Sexual activity: Yes    Partners: Male    Birth control/protection: Condom, I.U.D.  Other Topics Concern   Not on file  Social History Narrative   Married 6 years,second. First marriage lasted 7 years. 2 dogs, 1 bunny.   Social Determinants of Health   Financial Resource Strain: Not on file  Food Insecurity: Not on file  Transportation Needs: Not on file  Physical Activity: Not on file  Stress: Not on file  Social Connections: Not on file  Intimate Partner Violence: Not on file    ROS See HPI above    Objective    There were no vitals taken for this visit.  Physical Exam Vitals reviewed.  Constitutional:      General: She is not in acute distress.    Appearance: Normal appearance. She is obese. She is not ill-appearing, toxic-appearing or diaphoretic.  HENT:     Head: Normocephalic and atraumatic.  Eyes:     General:        Right eye: No discharge.        Left eye: No discharge.     Conjunctiva/sclera: Conjunctivae normal.  Cardiovascular:     Rate and Rhythm: Normal rate and regular rhythm.     Pulses:          Dorsalis pedis pulses are 3+ on the right side and 3+ on the left side.     Heart sounds: Normal heart sounds. No murmur heard.    No friction rub. No gallop.  Pulmonary:     Effort: Pulmonary effort is normal. No  respiratory distress.     Breath sounds: Normal breath sounds.  Musculoskeletal:        General: Normal range of motion.     Right lower leg: No edema.     Left lower leg: No edema.  Skin:    General: Skin is warm and dry.  Neurological:  General: No focal deficit present.     Mental Status: She is alert and oriented to person, place, and time. Mental status is at baseline.  Psychiatric:        Mood and Affect: Mood normal.        Behavior: Behavior normal.        Thought Content: Thought content normal.        Judgment: Judgment normal.      Assessment & Plan:  1.Ordered Hepatitis C screening. Ordered vitamin D for previous deficiency. Ordered lipids for screening, patient is fasting.  2.Request records from ophthalmologist-[facility]; psych-[facility], NP; and GYN-Dr. Linda Hedges.  3.Reviewed health maintenance-declines covid booster; requesting records for pap smear, ophthalmology exam. She reports she had her influenza vaccine in Oct 2023 with [facility] in [place].   Return in about 6 months (around 12/18/2022) for chronic management.   Valarie Merino, NP

## 2022-06-17 NOTE — Assessment & Plan Note (Addendum)
Continue with Rizatriptan as needed. She will notify if her headaches become uncontrollable and goes to a neurologist again.

## 2022-06-17 NOTE — Telephone Encounter (Signed)
Resent request for last 4 progress notes

## 2022-06-17 NOTE — Assessment & Plan Note (Addendum)
Continue with Metformin '1000mg'$  BID. Foot exam completed today with no concerns. Requesting records from ophthalmologist. Ordered A1c and CMP. Ordered urine microalbumin/creatinine ratio.

## 2022-06-18 LAB — MICROALBUMIN / CREATININE URINE RATIO
Creatinine,U: 224.2 mg/dL
Microalb Creat Ratio: 0.6 mg/g (ref 0.0–30.0)
Microalb, Ur: 1.4 mg/dL (ref 0.0–1.9)

## 2022-06-18 LAB — HEPATITIS C ANTIBODY: Hepatitis C Ab: NONREACTIVE

## 2022-06-18 NOTE — Telephone Encounter (Signed)
Pt is scheduled  For recheck of Vit.D in 3 months.

## 2022-06-18 NOTE — Telephone Encounter (Signed)
Pt has been notified about her Vit.D labs. Pt will stop her OTC.

## 2022-06-24 DIAGNOSIS — J0110 Acute frontal sinusitis, unspecified: Secondary | ICD-10-CM | POA: Diagnosis not present

## 2022-07-06 DIAGNOSIS — E119 Type 2 diabetes mellitus without complications: Secondary | ICD-10-CM | POA: Diagnosis not present

## 2022-07-14 DIAGNOSIS — F411 Generalized anxiety disorder: Secondary | ICD-10-CM | POA: Diagnosis not present

## 2022-07-29 ENCOUNTER — Other Ambulatory Visit: Payer: Self-pay

## 2022-07-29 DIAGNOSIS — R7303 Prediabetes: Secondary | ICD-10-CM

## 2022-07-29 MED ORDER — METFORMIN HCL ER 500 MG PO TB24: ORAL_TABLET | ORAL | 0 refills | Status: DC

## 2022-08-05 DIAGNOSIS — E119 Type 2 diabetes mellitus without complications: Secondary | ICD-10-CM | POA: Diagnosis not present

## 2022-08-11 ENCOUNTER — Other Ambulatory Visit: Payer: Self-pay

## 2022-09-05 DIAGNOSIS — E119 Type 2 diabetes mellitus without complications: Secondary | ICD-10-CM | POA: Diagnosis not present

## 2022-09-18 ENCOUNTER — Other Ambulatory Visit: Payer: BC Managed Care – PPO

## 2022-10-05 ENCOUNTER — Other Ambulatory Visit: Payer: Self-pay | Admitting: Family Medicine

## 2022-10-05 DIAGNOSIS — E119 Type 2 diabetes mellitus without complications: Secondary | ICD-10-CM | POA: Diagnosis not present

## 2022-10-05 DIAGNOSIS — E282 Polycystic ovarian syndrome: Secondary | ICD-10-CM

## 2022-10-28 DIAGNOSIS — F411 Generalized anxiety disorder: Secondary | ICD-10-CM | POA: Diagnosis not present

## 2022-11-04 ENCOUNTER — Encounter: Payer: Self-pay | Admitting: Family Medicine

## 2022-11-04 ENCOUNTER — Ambulatory Visit: Payer: BC Managed Care – PPO | Admitting: Family Medicine

## 2022-11-04 VITALS — BP 102/64 | Temp 98.2°F | Ht 67.0 in | Wt 249.5 lb

## 2022-11-04 DIAGNOSIS — R42 Dizziness and giddiness: Secondary | ICD-10-CM

## 2022-11-04 DIAGNOSIS — G43901 Migraine, unspecified, not intractable, with status migrainosus: Secondary | ICD-10-CM | POA: Diagnosis not present

## 2022-11-04 DIAGNOSIS — R112 Nausea with vomiting, unspecified: Secondary | ICD-10-CM

## 2022-11-04 MED ORDER — ONDANSETRON 4 MG PO TBDP: 4.0000 mg | ORAL_TABLET | Freq: Three times a day (TID) | ORAL | 0 refills | Status: AC | PRN

## 2022-11-04 MED ORDER — RIZATRIPTAN BENZOATE 10 MG PO TABS: 10.0000 mg | ORAL_TABLET | Freq: Every day | ORAL | 0 refills | Status: DC | PRN

## 2022-11-04 NOTE — Patient Instructions (Signed)
-  Refilled Maxalt that you have used previously for migraines.  -Placed a referral back to neurology since migraines have become more frequent.  -Prescribed Zofran 4mg , ODT every 8 hours as needed for nausea and vomiting. -Recommend to increase fluid intake as tolerated, at least 64 oz of water a day.  -If symptoms do not improve or become worse before you are able to be seen by neurologist, follow up sooner.

## 2022-11-04 NOTE — Progress Notes (Signed)
Acute Office Visit   Subjective:  Patient ID: Lisa Howe, female    DOB: Dec 10, 1980, 42 y.o.   MRN: [id number]  Chief Complaint  Patient presents with   Headache    Pt is here today with C/O of migraine for 2 days Sx- today nausea, dizzy, fatigue,vomit Pt has been bite by tick 1 month ago. Pt reports heavy spotting last few days.Pt has IUD.    Headache    Patient is complaining of migraine that is located behind her eyes and right occipital area. She reports intermittent migraine for the last 2 days. Associated symptoms of nausea for the last 2 days with vomiting yesterday, dizziness started today, and fatigue. She reports she has not had much to drink today.   She has a history of migraines. Patient has took Rizatriptan 10mg  in the past for headaches that helped. She has seen a neurologist previously before provider went back into practice.  She reports in the last couple of months she has noticed her headaches, migraines, becoming more frequent.   Review of Systems  Neurological:  Positive for headaches.   See HPI above     Objective:   Temp 98.2 F (36.8 C)   Ht 5\' 7"  (1.702 m)   Wt 249 lb 8 oz (113.2 kg)   SpO2 98%   BMI 39.08 kg/m    Physical Exam Vitals reviewed.  Constitutional:      General: She is not in acute distress.    Appearance: Normal appearance. She is obese. She is not ill-appearing, toxic-appearing or diaphoretic.  HENT:     Head: Normocephalic and atraumatic.  Eyes:     General:        Right eye: No discharge.        Left eye: No discharge.     Extraocular Movements: Extraocular movements intact.     Right eye: No nystagmus.     Left eye: No nystagmus.     Conjunctiva/sclera: Conjunctivae normal.     Pupils: Pupils are equal, round, and reactive to light.  Cardiovascular:     Rate and Rhythm: Normal rate and regular rhythm.     Heart sounds: Normal heart sounds. No murmur heard.    No friction rub. No gallop.  Pulmonary:     Effort:  Pulmonary effort is normal. No respiratory distress.     Breath sounds: Normal breath sounds.  Musculoskeletal:        General: Normal range of motion.  Skin:    General: Skin is warm and dry.  Neurological:     General: No focal deficit present.     Mental Status: She is alert and oriented to person, place, and time. Mental status is at baseline.     Cranial Nerves: Cranial nerves 2-12 are intact. No facial asymmetry.     Sensory: Sensation is intact.     Motor: No weakness.     Coordination: Coordination is intact.     Gait: Gait is intact.  Psychiatric:        Mood and Affect: Mood normal.        Speech: Speech normal.        Behavior: Behavior normal.        Thought Content: Thought content normal.        Judgment: Judgment normal.      Assessment & Plan:  Migraine with status migrainosus, not intractable, unspecified migraine type -     Ambulatory referral to Neurology -  Rizatriptan Benzoate; Take 1 tablet (10 mg total) by mouth daily as needed.  Dispense: 10 tablet; Refill: 0  Dizziness -     Orthostatic vital signs  Nausea and vomiting, unspecified vomiting type -     Ondansetron; Take 1 tablet (4 mg total) by mouth every 8 (eight) hours as needed for nausea or vomiting.  Dispense: 20 tablet; Refill: 0  -Orthostatics completed. Heart rate changes, suspect this is more from dehydration. She reports she has hardly drunk any fluids since about 2pm yesterday. Recommend to increase fluid intake as tolerated, at least 64 oz of water a day.  -Refilled Rizatriptan since it has been effective previously and placed a referral back to neurology since her migraines/headaches are becoming more frequent.  -Prescribed Zofran 4mg  ODT, 1 tablet every 8 hours as needed for nausea and vomiting.  --If symptoms do not improve or become worse before you are able to be seen by neurologist, follow up sooner.   Zandra Abts, NP

## 2022-11-05 DIAGNOSIS — E119 Type 2 diabetes mellitus without complications: Secondary | ICD-10-CM | POA: Diagnosis not present

## 2022-11-06 ENCOUNTER — Encounter: Payer: Self-pay | Admitting: Neurology

## 2022-11-16 ENCOUNTER — Encounter: Payer: Self-pay | Admitting: Family Medicine

## 2022-11-16 ENCOUNTER — Other Ambulatory Visit: Payer: Self-pay

## 2022-11-16 DIAGNOSIS — E1169 Type 2 diabetes mellitus with other specified complication: Secondary | ICD-10-CM

## 2022-11-19 ENCOUNTER — Encounter (INDEPENDENT_AMBULATORY_CARE_PROVIDER_SITE_OTHER): Payer: Self-pay

## 2022-11-26 ENCOUNTER — Encounter: Payer: Self-pay | Admitting: "Endocrinology

## 2022-11-26 ENCOUNTER — Ambulatory Visit (INDEPENDENT_AMBULATORY_CARE_PROVIDER_SITE_OTHER): Payer: BC Managed Care – PPO | Admitting: "Endocrinology

## 2022-11-26 VITALS — BP 108/76 | HR 72 | Ht 67.0 in | Wt 251.0 lb

## 2022-11-26 DIAGNOSIS — E782 Mixed hyperlipidemia: Secondary | ICD-10-CM | POA: Diagnosis not present

## 2022-11-26 DIAGNOSIS — E673 Hypervitaminosis D: Secondary | ICD-10-CM | POA: Diagnosis not present

## 2022-11-26 DIAGNOSIS — R7303 Prediabetes: Secondary | ICD-10-CM

## 2022-11-26 DIAGNOSIS — Z6839 Body mass index (BMI) 39.0-39.9, adult: Secondary | ICD-10-CM | POA: Diagnosis not present

## 2022-11-26 LAB — POCT GLYCOSYLATED HEMOGLOBIN (HGB A1C): HbA1c, POC (prediabetic range): 5.8 % (ref 5.7–6.4)

## 2022-11-26 MED ORDER — METFORMIN HCL ER 500 MG PO TB24
500.0000 mg | ORAL_TABLET | Freq: Every day | ORAL | 1 refills | Status: DC
Start: 2022-11-26 — End: 2023-04-12

## 2022-11-26 NOTE — Patient Instructions (Signed)

## 2022-11-26 NOTE — Progress Notes (Signed)
Endocrinology Consult Note       11/26/2022, 12:47 PM   Subjective:    Patient ID: Lisa Howe, female    DOB: 1980/09/03.  Lisa Howe is being seen in consultation for management of currently uncontrolled symptomatic diabetes requested by  Alveria Apley, NP.   Past Medical History:  Diagnosis Date   Anxiety    panic attacks   Depression    PP anxiety and depression   Excessive daytime sleepiness    Gestational diabetes    HSV-1 (herpes simplex virus 1) infection    IBS (irritable bowel syndrome)    not sure what caused it   Migraines    Obesity (BMI 30-39.9) 01/11/2019   OCD (obsessive compulsive disorder)    PCOS (polycystic ovarian syndrome)    Vaginal Pap smear, abnormal     Past Surgical History:  Procedure Laterality Date   TONSILLECTOMY     wisdom teeth       Social History   Socioeconomic History   Marital status: Married    Spouse name: Not on file   Number of children: 2   Years of education: Not on file   Highest education level: Associate degree: academic program  Occupational History   Not on file  Tobacco Use   Smoking status: Former    Current packs/day: 0.00    Types: Cigarettes    Start date: 01/2008    Quit date: 01/2013    Years since quitting: 9.8   Smokeless tobacco: Never   Tobacco comments:    3-5 cigarettes a day  Vaping Use   Vaping status: Never Used  Substance and Sexual Activity   Alcohol use: Yes    Comment: Once every 3 months.   Drug use: No   Sexual activity: Yes    Partners: Male    Birth control/protection: Condom, I.U.D.  Other Topics Concern   Not on file  Social History Narrative   Married 6 years,second. First marriage lasted 7 years. 2 dogs, 1 bunny.   Social Determinants of Health   Financial Resource Strain: Not on file  Food Insecurity: Not on file  Transportation Needs: Not on file  Physical Activity: Not on  file  Stress: Not on file  Social Connections: Unknown (08/16/2021)   [facility]   Social Network    Social Network: Not on file    Family History  Problem Relation Age of Onset   Diabetes Mother    Hypertension Mother    Hyperlipidemia Mother    Thyroid disease Mother    Pulmonary fibrosis Father    Hypertension Father    Hyperlipidemia Father    Hyperlipidemia Sister    Cancer Maternal Grandmother        melanoma   Parkinson's disease Maternal Grandfather    Heart disease Maternal Grandfather    Diabetes Maternal Grandfather    Cancer Paternal Grandmother        liver   Diabetes Paternal Grandmother    Heart disease Paternal Grandfather    COPD Paternal Grandfather     Outpatient Encounter Medications as of 11/26/2022  Medication Sig   busPIRone (BUSPAR) 10 MG tablet Take 10 mg by mouth 2 (two) times daily.   Multiple Vitamin (MULTIVITAMIN ADULT PO) Take 1 tablet by mouth daily.   [DISCONTINUED] BUSPIRONE HCL PO Take 150 mg by mouth 2 (two) times daily.   [DISCONTINUED] MAGNESIUM OXIDE, ELEMENTAL, PO Take 1 tablet by mouth daily.   ALPRAZolam (XANAX XR) 1 MG 24 hr tablet Take 1 mg by mouth daily. prn   azelastine (ASTELIN) 0.1 % nasal spray Place 2 sprays into both nostrils 2 (two) times daily.   Cetirizine HCl (ZYRTEC ALLERGY) 10 MG CAPS ZyrTEC   fluticasone (FLONASE) 50 MCG/ACT nasal spray SHAKE LIQUID AND USE 1 SPRAY IN EACH NOSTRIL TWICE DAILY AS NEEDED   Magnesium 100 MG CAPS Magnesium   metFORMIN (GLUCOPHAGE-XR) 500 MG 24 hr tablet Take 1 tablet (500 mg total) by mouth daily with breakfast.   ondansetron (ZOFRAN-ODT) 4 MG disintegrating tablet Take 1 tablet (4 mg total) by mouth every 8 (eight) hours as needed for nausea or vomiting.   Probiotic Product (FORTIFY PROBIOTIC WOMENS EX ST PO) Take by mouth daily.   rizatriptan (MAXALT) 10 MG tablet Take 1 tablet (10 mg total) by mouth daily as needed.   sertraline (ZOLOFT) 100 MG tablet  Take 200 mg by mouth daily.   spironolactone (ALDACTONE) 50 MG tablet TAKE 1 TABLET(50 MG) BY MOUTH DAILY   [DISCONTINUED] busPIRone (BUSPAR) 10 MG tablet Take 10 mg by mouth 2 (two) times daily as needed.   [DISCONTINUED] Cholecalciferol (VITAMIN D-3) 125 MCG (5000 UT) TABS Take 2 tablets by mouth daily. (Patient not taking: Reported on 11/04/2022)   [DISCONTINUED] Cyanocobalamin (B-12) 100 MCG TABS  (Patient not taking: Reported on 11/04/2022)   [DISCONTINUED] metFORMIN (GLUCOPHAGE-XR) 500 MG 24 hr tablet TAKE 2 TABLETS(1000 MG) BY MOUTH TWICE DAILY BEFORE A MEAL   [DISCONTINUED] thyroid (ARMOUR THYROID) 180 MG tablet  (Patient not taking: Reported on 11/04/2022)   [DISCONTINUED] thyroid (NP THYROID) 60 MG tablet  (Patient not taking: Reported on 11/04/2022)   [DISCONTINUED] Topiramate ER (TROKENDI XR) 50 MG CP24 Take 1 tablet by mouth daily. (Patient not taking: Reported on 11/04/2022)   No facility-administered encounter medications on file as of 11/26/2022.    ALLERGIES: Allergies  Allergen Reactions   Advil [Ibuprofen] Anaphylaxis    Coated / dye    Cefazolin Other (See Comments)   Penicillin G Other (See Comments)   Vancomycin Hives    Hives on chest and back. Tingling around mouth.    Amoxicillin Rash   Penicillins Rash and Other (See Comments)    Has patient had a PCN reaction causing immediate rash, facial/tongue/throat swelling, SOB or lightheadedness with hypotension: Yes  Has patient had a PCN reaction causing severe rash involving mucus membranes or skin necrosis: No  Has patient had a PCN reaction that required hospitalization No  Has patient had a PCN reaction occurring within the last 10 years: No  If all of the above answers are "NO", then may proceed with Cephalosporin use.    VACCINATION STATUS: Immunization History  Administered Date(s) Administered   Influenza,inj,Quad PF,6+ Mos 01/01/2021   Influenza-Unspecified 02/02/2020   Moderna Sars-Covid-2 Vaccination  06/24/2019, 07/26/2019, 02/02/2020   Td 04/06/2016   Tdap 04/06/2012    Diabetes She presents for her initial diabetic visit. Diabetes type: Prediabetes. The initial diagnosis of diabetes was made 1 year ago. Her disease course has been fluctuating. Hypoglycemia symptoms include headaches. Pertinent negatives for hypoglycemia include no confusion, pallor or  seizures. Associated symptoms include fatigue. Pertinent negatives for diabetes include no chest pain, no polydipsia, no polyphagia and no polyuria. There are no hypoglycemic complications. Symptoms are improving. (She has medical history of PCOS, hyperlipidemia, obesity, sedentary lifestyle.) Risk factors for coronary artery disease include dyslipidemia, family history, sedentary lifestyle and obesity (Prediabetes, metabolic syndrome). Current diabetic treatments: Metformin 1000 mg p.o. daily. Her weight is fluctuating minimally (She reports minimally fluctuating body weight, progressively increasing over time.). She has not had a previous visit with a dietitian. She rarely participates in exercise. (She does not monitor blood glucose regularly.  Her point-of-care A1c was 5.8%, improving from 6.1%.) An ACE inhibitor/angiotensin II receptor blocker is not being taken.  Hyperlipidemia This is a chronic problem. Recent lipid tests were reviewed and are high. Exacerbating diseases include obesity. Pertinent negatives include no chest pain, myalgias or shortness of breath. She is currently on no antihyperlipidemic treatment. Risk factors for coronary artery disease include dyslipidemia, family history and obesity (Prediabetes, metabolic syndrome).     Review of Systems  Constitutional:  Positive for fatigue. Negative for chills, fever and unexpected weight change.  HENT:  Negative for trouble swallowing and voice change.   Eyes:  Negative for visual disturbance.  Respiratory:  Negative for cough, shortness of breath and wheezing.   Cardiovascular:   Negative for chest pain, palpitations and leg swelling.  Gastrointestinal:  Negative for diarrhea, nausea and vomiting.  Endocrine: Negative for cold intolerance, heat intolerance, polydipsia, polyphagia and polyuria.  Musculoskeletal:  Negative for arthralgias and myalgias.  Skin:  Negative for color change, pallor, rash and wound.  Neurological:  Positive for headaches. Negative for seizures.  Psychiatric/Behavioral:  Negative for confusion and suicidal ideas.     Objective:       11/26/2022    8:33 AM 11/04/2022    3:59 PM 06/17/2022   10:41 AM  Vitals with BMI  Height 5\' 7"  5\' 7"  5\' 7"   Weight 251 lbs 249 lbs 8 oz 249 lbs 9 oz  BMI 39.3 39.07 39.08  Systolic 108 102 409  Diastolic 76 64 78  Pulse 72  75    BP 108/76   Pulse 72   Ht 5\' 7"  (1.702 m)   Wt 251 lb (113.9 kg)   BMI 39.31 kg/m   Wt Readings from Last 3 Encounters:  11/26/22 251 lb (113.9 kg)  11/04/22 249 lb 8 oz (113.2 kg)  06/17/22 249 lb 9 oz (113.2 kg)     Physical Exam Constitutional:      Appearance: She is well-developed.  HENT:     Head: Normocephalic and atraumatic.  Neck:     Thyroid: No thyromegaly.     Trachea: No tracheal deviation.  Cardiovascular:     Rate and Rhythm: Normal rate and regular rhythm.  Pulmonary:     Effort: Pulmonary effort is normal.     Breath sounds: Normal breath sounds.  Abdominal:     General: Bowel sounds are normal.     Palpations: Abdomen is soft.     Tenderness: There is no abdominal tenderness. There is no guarding.  Musculoskeletal:        General: Normal range of motion.     Cervical back: Normal range of motion and neck supple.  Skin:    General: Skin is warm and dry.     Coloration: Skin is not pale.     Findings: No erythema or rash.  Neurological:     Mental Status: She is alert and  oriented to person, place, and time.     Cranial Nerves: No cranial nerve deficit.     Coordination: Coordination normal.     Deep Tendon Reflexes: Reflexes are  normal and symmetric.  Psychiatric:        Judgment: Judgment normal.       CMP ( most recent) CMP     Component Value Date/Time   NA 137 06/17/2022 0844   K 4.3 06/17/2022 0844   CL 102 06/17/2022 0844   CO2 29 06/17/2022 0844   GLUCOSE 95 06/17/2022 0844   BUN 13 06/17/2022 0844   CREATININE 0.89 06/17/2022 0844   CREATININE 0.81 08/27/2020 1559   CALCIUM 9.8 06/17/2022 0844   PROT 7.5 06/17/2022 0844   ALBUMIN 4.2 06/17/2022 0844   AST 24 06/17/2022 0844   ALT 26 06/17/2022 0844   ALKPHOS 52 06/17/2022 0844   BILITOT 0.3 06/17/2022 0844   GFR 81.52 06/17/2022 0844   GFRNONAA 93 08/27/2020 1559     Diabetic Labs (most recent): Lab Results  Component Value Date   HGBA1C 5.8 11/26/2022   HGBA1C 6.1 06/17/2022   HGBA1C 5.7 (A) 09/30/2021   MICROALBUR 1.4 06/17/2022     Lipid Panel ( most recent) Lipid Panel     Component Value Date/Time   CHOL 184 06/17/2022 0844   TRIG 150.0 (H) 06/17/2022 0844   HDL 46.90 06/17/2022 0844   CHOLHDL 4 06/17/2022 0844   VLDL 30.0 06/17/2022 0844   LDLCALC 107 (H) 06/17/2022 0844   LDLCALC 82 08/27/2020 1559      Lab Results  Component Value Date   TSH 2.69 03/18/2021   TSH 0.04 (L) 08/27/2020   TSH 0.18 (L) 07/12/2019   TSH 0.94 01/11/2019   TSH 2.153 12/28/2013   FREET4 1.0 08/27/2020   FREET4 0.9 07/12/2019      Assessment & Plan:   1. Prediabetes 2. Mixed hyperlipidemia 3. BMI 39.0-39.9,adult 4. Hypervitaminosis D   - DAFFNE HAUGHNEY has currently controlled symptomatic prediabetes with point-of-care A1c of 5.8%, progressively improving from 6.1%.   Recent labs reviewed. - I had a long discussion with her about the possible risk factors and  the pathology behind its prediabetes, diabetes and its complications. -She has multiple risk factors including high BMI, hyperlipidemia, sedentary lifestyle and she remains at a high risk for more acute and chronic complications which include CAD, CVA, CKD, retinopathy,  and neuropathy. These are all discussed in detail with her.  Metabolic syndrome with multiple manifestations  makes her a Perfect candidate for lifestyle medicine.  - I discussed all available options of managing her pre-diabetes including de-escalation of medications. I have counseled her on Food as Medicine by adopting a Whole Food , Plant Predominant  ( WFPP) nutrition as recommended by [employer] of Lifestyle Medicine. Patient is encouraged to switch to  unprocessed or minimally processed  complex starch, adequate protein intake (mainly plant source), minimal liquid fat, plenty of fruits, and vegetables. -  she is advised to stick to a routine mealtimes to eat 3 complete meals a day and snack only when necessary ( to snack only to correct hypoglycemia BG <70 day time or <100 at night).   - she acknowledges that there is a room for improvement in her food and drink choices. - Further Specific Suggestion is made for her to avoid simple carbohydrates  from her diet including Cakes, Sweet Desserts, Ice Cream, Soda (diet and regular), Sweet Tea, Candies, Chips, Cookies, Store Bought Juices,  Alcohol ,  Artificial Sweeteners,  Coffee Creamer, and "Sugar-free" Products. This will help patient to have more stable blood glucose profile and potentially avoid unintended weight gain.  The following Lifestyle Medicine recommendations according to American College of Lifestyle Medicine [facility]) were discussed and offered to patient and she agrees to start the journey:  A.  Whole Foods, Plant-based plate comprising of fruits and vegetables, plant-based proteins, whole-grain carbohydrates was discussed in detail with the patient.   A list for source of those nutrients were also provided to the patient.  Patient will use only water or unsweetened tea for hydration. B.  The need to stay away from risky substances including alcohol, smoking; obtaining 7 to 9 hours of restorative sleep, at least 150 minutes of moderate  intensity exercise weekly, the importance of healthy social connections,  and stress reduction techniques were discussed. C.  A full color page of  Calorie density of various food groups per pound showing examples of each food groups was provided to the patient.  -She is advised to lower metformin to 500 mg XR p.o. daily at breakfast.  Her history also includes a course of NP thyroid treatment up to 180 mg daily without any documented history of hypothyroidism.  However she has not taken this medication in a long time.  She will have the following labs before her next visit to assess her baseline endocrine function.  - Lipid panel, TSH, free T4, free T3, TPO antibodies, thyroglobulin antibodies.  - Comprehensive metabolic panel,  Insulin and C-Peptide -She was also found to have hypervitaminosis D at 107.  She is advised to stay off vitamin D supplements until next measurement. -If her engagement to lifestyle medicine is not optimal, she will be considered for next options including GLP-1 receptor agonist for weight management.   - Specific targets for  A1c;  LDL, HDL,  and Triglycerides were discussed with the patient.  2) Blood Pressure /Hypertension:  her blood pressure is  controlled to target.   she is advised to continue her current medications including spironolactone 50 mg p.o. daily -which appears to have been started because of her history of PCOS.   3) Lipids/Hyperlipidemia:   Review of her recent lipid panel showed uncontrolled  LDL at 107 .  she  is now on statins.  Whole food plant-based diet discussed above will help with dyslipidemia.  If her next measurement of LDL is greater than 100 mg per DL, she will be considered for low-dose statin.     4)  Weight/Diet:  Body mass index is 39.31 kg/m.  -   clearly complicating her diabetes care.   she is  a candidate for weight loss. I discussed with her the fact that loss of 5 - 10% of her  current body weight will have the most impact on  her diabetes management.  The above detailed  ACLM recommendations for nutrition, exercise, sleep, social life, avoidance of risky substances, the need for restorative sleep   information will also detailed on discharge instructions.   5) Chronic Care/Health Maintenance:  -she  is not  on ACEI/ARB and Statin medications and  is encouraged to initiate and continue to follow up with Ophthalmology, Dentist,  Podiatrist at least yearly or according to recommendations, and advised to  stay away from smoking. I have recommended yearly flu vaccine and pneumonia vaccine at least every 5 years; moderate intensity exercise for up to 150 minutes weekly; and  sleep for 7- 9 hours a day.  -  she is  advised to maintain close follow up with Alveria Apley, NP for primary care needs, as well as her other providers for optimal and coordinated care.   Thank you for involving me in the care of this pleasant patient.  I spent  63  minutes in the care of the patient today including review of labs from CMP, Lipids, Thyroid Function, Hematology (current and previous including abstractions from other facilities); face-to-face time discussing  her blood glucose readings/logs, discussing hypoglycemia and hyperglycemia episodes and symptoms, medications doses, her options of short and long term treatment based on the latest standards of care / guidelines;  discussion about incorporating lifestyle medicine;  and documenting the encounter. Risk reduction counseling performed per USPSTF guidelines to reduce  obesity and cardiovascular risk factors.      Please refer to Patient Instructions for Blood Glucose Monitoring and Insulin/Medications Dosing Guide"  in media tab for additional information. Please  also refer to " Patient Self Inventory" in the Media  tab for reviewed elements of pertinent patient history.  Colin Mulders participated in the discussions, expressed understanding, and voiced agreement with the above  plans.  All questions were answered to her satisfaction. she is encouraged to contact clinic should she have any questions or concerns prior to her return visit.   Follow up plan: - Return in about 3 months (around 02/26/2023) for Fasting Labs  in AM B4 8, A1c -[place].  Marquis Lunch, MD [facility] [address] [place], [place] [address] Phone: [phone]  Fax: [phone]    11/26/2022, 12:47 PM  This note was partially dictated with voice recognition software. Similar sounding words can be transcribed inadequately or may not  be corrected upon review.

## 2022-12-06 DIAGNOSIS — E119 Type 2 diabetes mellitus without complications: Secondary | ICD-10-CM | POA: Diagnosis not present

## 2022-12-18 ENCOUNTER — Ambulatory Visit: Payer: BC Managed Care – PPO | Admitting: Family Medicine

## 2022-12-18 DIAGNOSIS — E1169 Type 2 diabetes mellitus with other specified complication: Secondary | ICD-10-CM

## 2023-01-05 DIAGNOSIS — E119 Type 2 diabetes mellitus without complications: Secondary | ICD-10-CM | POA: Diagnosis not present

## 2023-01-06 DIAGNOSIS — Z6838 Body mass index (BMI) 38.0-38.9, adult: Secondary | ICD-10-CM | POA: Diagnosis not present

## 2023-01-06 DIAGNOSIS — Z1231 Encounter for screening mammogram for malignant neoplasm of breast: Secondary | ICD-10-CM | POA: Diagnosis not present

## 2023-01-06 DIAGNOSIS — Z01419 Encounter for gynecological examination (general) (routine) without abnormal findings: Secondary | ICD-10-CM | POA: Diagnosis not present

## 2023-01-18 DIAGNOSIS — Z30433 Encounter for removal and reinsertion of intrauterine contraceptive device: Secondary | ICD-10-CM | POA: Diagnosis not present

## 2023-02-05 DIAGNOSIS — E119 Type 2 diabetes mellitus without complications: Secondary | ICD-10-CM | POA: Diagnosis not present

## 2023-02-15 ENCOUNTER — Ambulatory Visit: Payer: BC Managed Care – PPO | Admitting: Neurology

## 2023-02-16 DIAGNOSIS — E782 Mixed hyperlipidemia: Secondary | ICD-10-CM | POA: Diagnosis not present

## 2023-02-16 DIAGNOSIS — E673 Hypervitaminosis D: Secondary | ICD-10-CM | POA: Diagnosis not present

## 2023-02-16 DIAGNOSIS — R7303 Prediabetes: Secondary | ICD-10-CM | POA: Diagnosis not present

## 2023-02-17 LAB — VITAMIN D 25 HYDROXY (VIT D DEFICIENCY, FRACTURES): Vit D, 25-Hydroxy: 44.5 ng/mL (ref 30.0–100.0)

## 2023-02-17 LAB — COMPREHENSIVE METABOLIC PANEL
ALT: 18 [IU]/L (ref 0–32)
AST: 16 [IU]/L (ref 0–40)
Albumin: 4.2 g/dL (ref 3.9–4.9)
Alkaline Phosphatase: 68 [IU]/L (ref 44–121)
BUN/Creatinine Ratio: 13 (ref 9–23)
BUN: 12 mg/dL (ref 6–24)
Bilirubin Total: <0.2 mg/dL (ref 0.0–1.2)
CO2: 24 mmol/L (ref 20–29)
Calcium: 9.3 mg/dL (ref 8.7–10.2)
Chloride: 101 mmol/L (ref 96–106)
Creatinine, Ser: 0.94 mg/dL (ref 0.57–1.00)
Globulin, Total: 3.1 g/dL (ref 1.5–4.5)
Glucose: 105 mg/dL — ABNORMAL HIGH (ref 70–99)
Potassium: 4.6 mmol/L (ref 3.5–5.2)
Sodium: 139 mmol/L (ref 134–144)
Total Protein: 7.3 g/dL (ref 6.0–8.5)
eGFR: 79 mL/min/{1.73_m2} (ref >59)

## 2023-02-17 LAB — LIPID PANEL
Chol/HDL Ratio: 4.6 {ratio} — ABNORMAL HIGH (ref 0.0–4.4)
Cholesterol, Total: 184 mg/dL (ref 100–199)
HDL: 40 mg/dL (ref >39)
LDL Chol Calc (NIH): 108 mg/dL — ABNORMAL HIGH (ref 0–99)
Triglycerides: 209 mg/dL — ABNORMAL HIGH (ref 0–149)
VLDL Cholesterol Cal: 36 mg/dL (ref 5–40)

## 2023-02-17 LAB — INSULIN AND C-PEPTIDE, SERUM
C-Peptide: 12 ng/mL — ABNORMAL HIGH (ref 1.1–4.4)
INSULIN: 92.1 u[IU]/mL — ABNORMAL HIGH (ref 2.6–24.9)

## 2023-02-17 LAB — VITAMIN B12: Vitamin B-12: 705 pg/mL (ref 232–1245)

## 2023-02-17 LAB — T4, FREE: Free T4: 1 ng/dL (ref 0.82–1.77)

## 2023-02-17 LAB — THYROID PEROXIDASE ANTIBODY: Thyroperoxidase Ab SerPl-aCnc: 38 [IU]/mL — ABNORMAL HIGH (ref 0–34)

## 2023-02-17 LAB — TSH: TSH: 2.52 u[IU]/mL (ref 0.450–4.500)

## 2023-02-17 LAB — THYROGLOBULIN ANTIBODY: Thyroglobulin Antibody: <1 [IU]/mL (ref 0.0–0.9)

## 2023-02-17 LAB — T3, FREE: T3, Free: 2.4 pg/mL (ref 2.0–4.4)

## 2023-02-24 DIAGNOSIS — Z30431 Encounter for routine checking of intrauterine contraceptive device: Secondary | ICD-10-CM | POA: Diagnosis not present

## 2023-02-25 ENCOUNTER — Ambulatory Visit (INDEPENDENT_AMBULATORY_CARE_PROVIDER_SITE_OTHER): Payer: BC Managed Care – PPO | Admitting: "Endocrinology

## 2023-02-25 ENCOUNTER — Encounter: Payer: Self-pay | Admitting: "Endocrinology

## 2023-02-25 VITALS — BP 112/82 | HR 68 | Ht 67.0 in | Wt 257.4 lb

## 2023-02-25 DIAGNOSIS — E782 Mixed hyperlipidemia: Secondary | ICD-10-CM | POA: Diagnosis not present

## 2023-02-25 DIAGNOSIS — Z6839 Body mass index (BMI) 39.0-39.9, adult: Secondary | ICD-10-CM | POA: Diagnosis not present

## 2023-02-25 DIAGNOSIS — R7303 Prediabetes: Secondary | ICD-10-CM | POA: Diagnosis not present

## 2023-02-25 DIAGNOSIS — E673 Hypervitaminosis D: Secondary | ICD-10-CM | POA: Insufficient documentation

## 2023-02-25 LAB — POCT GLYCOSYLATED HEMOGLOBIN (HGB A1C): HbA1c, POC (controlled diabetic range): 6.4 % (ref 0.0–7.0)

## 2023-02-25 MED ORDER — TIRZEPATIDE 2.5 MG/0.5ML ~~LOC~~ SOAJ: 2.5000 mg | SUBCUTANEOUS | 0 refills | Status: DC

## 2023-02-25 MED ORDER — INSULIN PEN NEEDLE 29G X 5MM MISC: 1 refills | Status: AC

## 2023-02-25 NOTE — Progress Notes (Signed)
02/25/2023, 1:32 PM   Endocrinology follow-up note  Subjective:    Patient ID: Lisa Howe, female    DOB: 02-16-1981.  ISHITA LAULETTA is being seen in follow-up after she was seen in consultation for management of prediabetes , hyperlipidemia, weight management requested by  Alveria Apley, NP.   Past Medical History:  Diagnosis Date   Anxiety    panic attacks   Depression    PP anxiety and depression   Excessive daytime sleepiness    Gestational diabetes    HSV-1 (herpes simplex virus 1) infection    IBS (irritable bowel syndrome)    not sure what caused it   Migraines    Obesity (BMI 30-39.9) 01/11/2019   OCD (obsessive compulsive disorder)    PCOS (polycystic ovarian syndrome)    Vaginal Pap smear, abnormal     Past Surgical History:  Procedure Laterality Date   TONSILLECTOMY     wisdom teeth       Social History   Socioeconomic History   Marital status: Married    Spouse name: Not on file   Number of children: 2   Years of education: Not on file   Highest education level: Associate degree: academic program  Occupational History   Not on file  Tobacco Use   Smoking status: Former    Current packs/day: 0.00    Types: Cigarettes    Start date: 01/2008    Quit date: 01/2013    Years since quitting: 10.1   Smokeless tobacco: Never   Tobacco comments:    3-5 cigarettes a day  Vaping Use   Vaping status: Never Used  Substance and Sexual Activity   Alcohol use: Yes    Comment: Once every 3 months.   Drug use: No   Sexual activity: Yes    Partners: Male    Birth control/protection: Condom, I.U.D.  Other Topics Concern   Not on file  Social History Narrative   Married 6 years,second. First marriage lasted 7 years. 2 dogs, 1 bunny.   Social Determinants of Health   Financial Resource Strain: Not on file  Food Insecurity: Not on file  Transportation Needs: Not  on file  Physical Activity: Not on file  Stress: Not on file  Social Connections: Unknown (08/16/2021)   Received from [facility], [facility]   Social Network    Social Network: Not on file    Family History  Problem Relation Age of Onset   Diabetes Mother    Hypertension Mother    Hyperlipidemia Mother    Thyroid disease Mother    Pulmonary fibrosis Father    Hypertension Father    Hyperlipidemia Father    Hyperlipidemia Sister    Cancer Maternal Grandmother        melanoma   Parkinson's disease Maternal Grandfather    Heart disease Maternal Grandfather    Diabetes Maternal Grandfather    Cancer Paternal Grandmother        liver   Diabetes Paternal Grandmother    Heart disease Paternal Grandfather    COPD Paternal Grandfather     Outpatient Encounter Medications as  of 02/25/2023  Medication Sig   Insulin Pen Needle 29G X MISC Use to inject Mounjaro weekly   tirzepatide [facility]) 2.5 MG/0.5ML Pen Inject 2.5 mg into the skin once a week.   ALPRAZolam (XANAX XR) 1 MG 24 hr tablet Take 1 mg by mouth daily. prn   azelastine (ASTELIN) 0.1 % nasal spray Place 2 sprays into both nostrils 2 (two) times daily.   busPIRone (BUSPAR) 10 MG tablet Take 10 mg by mouth 2 (two) times daily.   Cetirizine HCl (ZYRTEC ALLERGY) 10 MG CAPS ZyrTEC   fluticasone (FLONASE) 50 MCG/ACT nasal spray SHAKE LIQUID AND USE 1 SPRAY IN EACH NOSTRIL TWICE DAILY AS NEEDED   Magnesium 100 MG CAPS Magnesium   metFORMIN (GLUCOPHAGE-XR) 500 MG 24 hr tablet Take 1 tablet (500 mg total) by mouth daily with breakfast.   Multiple Vitamin (MULTIVITAMIN ADULT PO) Take 1 tablet by mouth daily.   ondansetron (ZOFRAN-ODT) 4 MG disintegrating tablet Take 1 tablet (4 mg total) by mouth every 8 (eight) hours as needed for nausea or vomiting.   Probiotic Product (FORTIFY PROBIOTIC WOMENS EX ST PO) Take by mouth daily.   rizatriptan (MAXALT) 10 MG tablet Take 1 tablet (10 mg total) by mouth daily as needed.    sertraline (ZOLOFT) 100 MG tablet Take 200 mg by mouth daily.   spironolactone (ALDACTONE) 50 MG tablet TAKE 1 TABLET(50 MG) BY MOUTH DAILY   No facility-administered encounter medications on file as of 02/25/2023.    ALLERGIES: Allergies  Allergen Reactions   Advil [Ibuprofen] Anaphylaxis    Coated / dye    Cefazolin Other (See Comments)   Penicillin G Other (See Comments)   Vancomycin Hives    Hives on chest and back. Tingling around mouth.    Amoxicillin Rash   Penicillins Rash and Other (See Comments)    Has patient had a PCN reaction causing immediate rash, facial/tongue/throat swelling, SOB or lightheadedness with hypotension: Yes  Has patient had a PCN reaction causing severe rash involving mucus membranes or skin necrosis: No  Has patient had a PCN reaction that required hospitalization No  Has patient had a PCN reaction occurring within the last 10 years: No  If all of the above answers are "NO", then may proceed with Cephalosporin use.    VACCINATION STATUS: Immunization History  Administered Date(s) Administered   Influenza,inj,Quad PF,6+ Mos 01/01/2021   Influenza-Unspecified 02/02/2020   Moderna Sars-Covid-2 Vaccination 06/24/2019, 07/26/2019, 02/02/2020   Td 04/06/2016   Tdap 04/06/2012    Diabetes She presents for her follow-up diabetic visit. Diabetes type: Prediabetes. The initial diagnosis of diabetes was made 1 year ago. Her disease course has been worsening. Hypoglycemia symptoms include headaches. Pertinent negatives for hypoglycemia include no confusion, pallor or seizures. Associated symptoms include fatigue. Pertinent negatives for diabetes include no chest pain, no polydipsia, no polyphagia and no polyuria. There are no hypoglycemic complications. Symptoms are improving. (She has medical history of PCOS, hyperlipidemia, obesity, sedentary lifestyle.) Risk factors for coronary artery disease include dyslipidemia, family history, sedentary lifestyle and  obesity (Prediabetes, metabolic syndrome, PCOS, gestational diabetes x 2.). Current diabetic treatments: Metformin 500 mg p.o. daily. Her weight is increasing steadily (She reports minimally fluctuating body weight, progressively increasing over time.). She has not had a previous visit with a dietitian. She rarely participates in exercise. Her home blood glucose trend is increasing steadily. (She does not monitor blood glucose regularly.  Her point-of-care A1c is 6.4%.  Progressively worsening from 5.8%.   )  An ACE inhibitor/angiotensin II receptor blocker is not being taken.  Hyperlipidemia This is a chronic problem. Recent lipid tests were reviewed and are high. Exacerbating diseases include obesity. Pertinent negatives include no chest pain, myalgias or shortness of breath. She is currently on no antihyperlipidemic treatment. Risk factors for coronary artery disease include dyslipidemia, family history and obesity (Prediabetes, metabolic syndrome).     Review of Systems  Constitutional:  Positive for fatigue. Negative for chills, fever and unexpected weight change.  HENT:  Negative for trouble swallowing and voice change.   Eyes:  Negative for visual disturbance.  Respiratory:  Negative for cough, shortness of breath and wheezing.   Cardiovascular:  Negative for chest pain, palpitations and leg swelling.  Gastrointestinal:  Negative for diarrhea, nausea and vomiting.  Endocrine: Negative for cold intolerance, heat intolerance, polydipsia, polyphagia and polyuria.  Musculoskeletal:  Negative for arthralgias and myalgias.  Skin:  Negative for color change, pallor, rash and wound.  Neurological:  Positive for headaches. Negative for seizures.  Psychiatric/Behavioral:  Negative for confusion and suicidal ideas.     Objective:       02/25/2023    8:51 AM 11/26/2022    8:33 AM 11/04/2022    3:59 PM  Vitals with BMI  Height 5\' 7"  5\' 7"  5\' 7"   Weight 257 lbs 6 oz 251 lbs 249 lbs 8 oz  BMI  40.31 39.3 39.07  Systolic 112 108 161  Diastolic 82 76 64  Pulse 68 72     BP 112/82   Pulse 68   Ht 5\' 7"  (1.702 m)   Wt 257 lb 6.4 oz (116.8 kg)   BMI 40.31 kg/m   Wt Readings from Last 3 Encounters:  02/25/23 257 lb 6.4 oz (116.8 kg)  11/26/22 251 lb (113.9 kg)  11/04/22 249 lb 8 oz (113.2 kg)     Diabetic Labs (most recent): Lab Results  Component Value Date   HGBA1C 6.4 02/25/2023   HGBA1C 5.8 11/26/2022   HGBA1C 6.1 06/17/2022   MICROALBUR 1.4 06/17/2022     Lipid Panel ( most recent) Lipid Panel     Component Value Date/Time   CHOL 184 02/16/2023 1409   TRIG 209 (H) 02/16/2023 1409   HDL 40 02/16/2023 1409   CHOLHDL 4.6 (H) 02/16/2023 1409   CHOLHDL 4 06/17/2022 0844   VLDL 30.0 06/17/2022 0844   LDLCALC 108 (H) 02/16/2023 1409   LDLCALC 82 08/27/2020 1559   LABVLDL 36 02/16/2023 1409     Lab Results  Component Value Date   TSH 2.520 02/16/2023   TSH 2.69 03/18/2021   TSH 0.04 (L) 08/27/2020   TSH 0.18 (L) 07/12/2019   TSH 0.94 01/11/2019   TSH 2.153 12/28/2013   FREET4 1.00 02/16/2023   FREET4 1.0 08/27/2020   FREET4 0.9 07/12/2019    Recent Results (from the past 2160 hour(s))  Comprehensive metabolic panel     Status: Abnormal   Collection Time: 02/16/23  2:09 PM  Result Value Ref Range   Glucose 105 (H) 70 - 99 mg/dL   BUN 12 6 - 24 mg/dL   Creatinine, Ser 0.96 0.57 - 1.00 mg/dL   eGFR 79 >04 VW/UJW/1.19   BUN/Creatinine Ratio 13 9 - 23   Sodium 139 134 - 144 mmol/L   Potassium 4.6 3.5 - 5.2 mmol/L   Chloride 101 96 - 106 mmol/L   CO2 24 20 - 29 mmol/L   Calcium 9.3 8.7 - 10.2 mg/dL   Total Protein 7.3  6.0 - 8.5 g/dL   Albumin 4.2 3.9 - 4.9 g/dL   Globulin, Total 3.1 1.5 - 4.5 g/dL   Bilirubin Total <6.9 0.0 - 1.2 mg/dL   Alkaline Phosphatase 68 44 - 121 IU/L   AST 16 0 - 40 IU/L   ALT 18 0 - 32 IU/L  Lipid panel     Status: Abnormal   Collection Time: 02/16/23  2:09 PM  Result Value Ref Range   Cholesterol, Total 184 100 - 199  mg/dL   Triglycerides 629 (H) 0 - 149 mg/dL   HDL 40 >52 mg/dL   VLDL Cholesterol Cal 36 5 - 40 mg/dL   LDL Chol Calc (NIH) 841 (H) 0 - 99 mg/dL   Chol/HDL Ratio 4.6 (H) 0.0 - 4.4 ratio    Comment:                                   T. Chol/HDL Ratio                                             Men  Women                               1/2 Avg.Risk  3.4    3.3                                   Avg.Risk  5.0    4.4                                2X Avg.Risk  9.6    7.1                                3X Avg.Risk 23.4   11.0   TSH     Status: None   Collection Time: 02/16/23  2:09 PM  Result Value Ref Range   TSH 2.520 0.450 - 4.500 uIU/mL  T4, free     Status: None   Collection Time: 02/16/23  2:09 PM  Result Value Ref Range   Free T4 1.00 0.82 - 1.77 ng/dL  T3, free     Status: None   Collection Time: 02/16/23  2:09 PM  Result Value Ref Range   T3, Free 2.4 2.0 - 4.4 pg/mL  Thyroid peroxidase antibody     Status: Abnormal   Collection Time: 02/16/23  2:09 PM  Result Value Ref Range   Thyroperoxidase Ab SerPl-aCnc 38 (H) 0 - 34 IU/mL  Thyroglobulin antibody     Status: None   Collection Time: 02/16/23  2:09 PM  Result Value Ref Range   Thyroglobulin Antibody <1.0 0.0 - 0.9 IU/mL    Comment: Thyroglobulin Antibody measured by [employer] Methodology It should be noted that the presence of thyroglobulin antibodies may not be pathogenic nor diagnostic, especially at very low levels. The assay manufacturer has found that four percent of individuals without evidence of thyroid disease or autoimmunity will have positive TgAb levels up to 4 IU/mL.   VITAMIN D 25 Hydroxy (Vit-D Deficiency,  Fractures)     Status: None   Collection Time: 02/16/23  2:09 PM  Result Value Ref Range   Vit D, 25-Hydroxy 44.5 30.0 - 100.0 ng/mL    Comment: Vitamin D deficiency has been defined by the Institute of Medicine and an Endocrine Society practice guideline as a level of serum 25-OH vitamin D  less than 20 ng/mL (1,2). The Endocrine Society went on to further define vitamin D insufficiency as a level between 21 and 29 ng/mL (2). 1. IOM (Institute of Medicine). 2010. Dietary reference    intakes for calcium and D. [place]: The    [employer]. 2. Holick MF, Binkley Garner, Bischoff-Ferrari HA, et al.    Evaluation, treatment, and prevention of vitamin D    deficiency: an Endocrine Society clinical practice    guideline. JCEM. 2011 Jul; 96(7):1911-30.   Vitamin B12     Status: None   Collection Time: 02/16/23  2:09 PM  Result Value Ref Range   Vitamin B-12 705 232 - 1,245 pg/mL  Insulin and C-Peptide     Status: Abnormal   Collection Time: 02/16/23  2:09 PM  Result Value Ref Range   INSULIN 92.1 (H) 2.6 - 24.9 uIU/mL   C-Peptide 12.0 (H) 1.1 - 4.4 ng/mL    Comment: C-Peptide reference interval is for fasting patients.  HgB A1c     Status: None   Collection Time: 02/25/23 10:18 AM  Result Value Ref Range   Hemoglobin A1C     HbA1c POC (<> result, manual entry)     HbA1c, POC (prediabetic range)     HbA1c, POC (controlled diabetic range) 6.4 0.0 - 7.0 %     Assessment & Plan:   1. Prediabetes 2. Mixed hyperlipidemia 3. BMI 39.0-39.9,adult 4. Hypervitaminosis D   - ENAYA WEYLAND has currently controlled symptomatic prediabetes with point-of-care A1c of 6.4% increasing from 5.8%.  Her previsit labs were discussed with her which includes hyperinsulinemia indicative of insulin resistance.   - I had a long discussion with her about the additive and synergistic risk factors for cardiovascular disease.  Patient has prior history of gestational diabetes x 2, and medical history of PCOS.    -She has additional  risk factors including high BMI, hyperlipidemia, sedentary lifestyle and she remains at a high risk for more acute and chronic complications which include CAD, CVA, CKD, retinopathy, and neuropathy. These are all discussed in detail with her.  Metabolic  syndrome with multiple manifestations  makes her a Perfect candidate for lifestyle medicine.  -She would benefit from early intervention with pharmaceuticals to help her achieve weight loss. -I discussed and prescribed Mounjaro 2.5 mg subcutaneously weekly.  This medication will be advanced as she tolerates.  - I discussed all available options of managing her pre-diabetes including de-escalation of medications. I have counseled her on Food as Medicine by adopting a Whole Food , Plant Predominant  ( WFPP) nutrition as recommended by [employer] of Lifestyle Medicine. Patient is encouraged to switch to  unprocessed or minimally processed  complex starch, adequate protein intake (mainly plant source), minimal liquid fat, plenty of fruits, and vegetables. -  she is advised to stick to a routine mealtimes to eat 3 complete meals a day and snack only when necessary ( to snack only to correct hypoglycemia BG <70 day time or <100 at night).   - she acknowledges that there is a room for improvement in her food and drink choices. - Suggestion is made  for her to avoid simple carbohydrates  from her diet including Cakes, Sweet Desserts, Ice Cream, Soda (diet and regular), Sweet Tea, Candies, Chips, Cookies, Store Bought Juices, Alcohol , Artificial Sweeteners,  Coffee Creamer, and "Sugar-free" Products, Lemonade. This will help patient to have more stable blood glucose profile and potentially avoid unintended weight gain.  The following Lifestyle Medicine recommendations according to American College of Lifestyle Medicine  [facility]) were discussed and and offered to patient and she  agrees to start the journey:  A. Whole Foods, Plant-Based Nutrition comprising of fruits and vegetables, plant-based proteins, whole-grain carbohydrates was discussed in detail with the patient.   A list for source of those nutrients were also provided to the patient.  Patient will use only water or unsweetened tea for hydration. B.   The need to stay away from risky substances including alcohol, smoking; obtaining 7 to 9 hours of restorative sleep, at least 150 minutes of moderate intensity exercise weekly, the importance of healthy social connections,  and stress management techniques were discussed. C.  A full color page of  Calorie density of various food groups per pound showing examples of each food groups was provided to the patient.    -She is advised to continue metformin 500 mg XR p.o. daily at breakfast.   -She has euthyroid state, will not need any thyroid hormone replacement. -Vitamin D has dropped to appropriate levels at 44.  She may need maintenance dose vitamin D with 2000 units daily. She is also on multivitamin supplements.  - Specific targets for  A1c;  LDL, HDL,  and Triglycerides were discussed with the patient.  2) Blood Pressure /Hypertension:  Her blood pressure is controlled to target.   she is advised to continue her current medications including spironolactone 50 mg p.o. daily -which appears to have been started because of her history of PCOS.   3) Lipids/Hyperlipidemia:   Review of her recent lipid panel showed uncontrolled  LDL at 108 .  she  is not on statins.  Whole food plant-based diet discussed above will help with dyslipidemia.  If her next measurement of LDL is greater than 100 mg per DL, she will be considered for low-dose statin.     4)  Weight/Diet:  Body mass index is 40.31 kg/m.  -   clearly complicating her cardiometabolic health.  she is  a candidate for weight loss. I discussed with her the fact that loss of 5 - 10% of her  current body weight will have the most impact on her diabetes management.  The above detailed  ACLM recommendations for nutrition, exercise, sleep, social life, avoidance of risky substances, the need for restorative sleep   information will also detailed on discharge instructions.   5) Chronic Care/Health Maintenance:  -she  is not  on ACEI/ARB and Statin  medications and  is encouraged to initiate and continue to follow up with Ophthalmology, Dentist,  Podiatrist at least yearly or according to recommendations, and advised to  stay away from smoking. I have recommended yearly flu vaccine and pneumonia vaccine at least every 5 years; moderate intensity exercise for up to 150 minutes weekly; and  sleep for 7- 9 hours a day.  - she is  advised to maintain close follow up with Alveria Apley, NP for primary care needs, as well as her other providers for optimal and coordinated care.   I spent  25  minutes in the care of the patient today including review of labs  from Thyroid Function, CMP, and other relevant labs ; imaging/biopsy records (current and previous including abstractions from other facilities); face-to-face time discussing  her lab results and symptoms, medications doses, her options of short and long term treatment based on the latest standards of care / guidelines;   and documenting the encounter.  Colin Mulders  participated in the discussions, expressed understanding, and voiced agreement with the above plans.  All questions were answered to her satisfaction. she is encouraged to contact clinic should she have any questions or concerns prior to her return visit.   Follow up plan: - Return in about 4 weeks (around 03/25/2023) for F/U with no Labs.  Marquis Lunch, MD [facility] [address] [place], [place] [address] Phone: [phone]  Fax: [phone]    02/25/2023, 1:32 PM  This note was partially dictated with voice recognition software. Similar sounding words can be transcribed inadequately or may not  be corrected upon review.

## 2023-02-25 NOTE — Patient Instructions (Signed)

## 2023-02-26 ENCOUNTER — Telehealth: Payer: Self-pay

## 2023-02-26 ENCOUNTER — Other Ambulatory Visit: Payer: Self-pay | Admitting: "Endocrinology

## 2023-02-26 MED ORDER — ZEPBOUND 2.5 MG/0.5ML ~~LOC~~ SOAJ: 2.5000 mg | SUBCUTANEOUS | 0 refills | Status: DC

## 2023-02-26 NOTE — Telephone Encounter (Signed)
Pharmacy Patient Advocate Encounter   Received notification from CoverMyMeds that prior authorization for [facility] is required/requested.   Insurance verification completed.   The patient is insured through [facility] .   Per test claim: CANCELLED due to Greggory Keen is only approved with a diagnosis of type 2 diabetes. Labs showing an A1C of 6.5 or above is required.

## 2023-03-01 NOTE — Telephone Encounter (Signed)
Left a message requesting pt return call to the office.

## 2023-03-02 NOTE — Telephone Encounter (Signed)
Spoke with pt, she stated that the [facility] was finally approved. States she has had her first injection and the pharmacy cancelled the Rx for the Zepbound.

## 2023-03-07 DIAGNOSIS — E119 Type 2 diabetes mellitus without complications: Secondary | ICD-10-CM | POA: Diagnosis not present

## 2023-03-15 ENCOUNTER — Ambulatory Visit: Payer: BC Managed Care – PPO | Admitting: "Endocrinology

## 2023-03-26 ENCOUNTER — Encounter: Payer: Self-pay | Admitting: "Endocrinology

## 2023-03-26 ENCOUNTER — Ambulatory Visit (INDEPENDENT_AMBULATORY_CARE_PROVIDER_SITE_OTHER): Payer: BC Managed Care – PPO | Admitting: "Endocrinology

## 2023-03-26 VITALS — BP 100/78 | HR 76 | Ht 67.0 in | Wt 250.0 lb

## 2023-03-26 DIAGNOSIS — R7303 Prediabetes: Secondary | ICD-10-CM

## 2023-03-26 DIAGNOSIS — E673 Hypervitaminosis D: Secondary | ICD-10-CM

## 2023-03-26 DIAGNOSIS — E782 Mixed hyperlipidemia: Secondary | ICD-10-CM | POA: Diagnosis not present

## 2023-03-26 DIAGNOSIS — Z6839 Body mass index (BMI) 39.0-39.9, adult: Secondary | ICD-10-CM | POA: Diagnosis not present

## 2023-03-26 MED ORDER — ZEPBOUND 5 MG/0.5ML ~~LOC~~ SOAJ: 5.0000 mg | SUBCUTANEOUS | 0 refills | Status: DC

## 2023-03-26 NOTE — Progress Notes (Signed)
03/26/2023, 9:28 AM   Endocrinology follow-up note  Subjective:    Patient ID: Lisa Howe, female    DOB: 10-Jul-1980.  Lisa Howe is being seen in follow-up after she was seen in consultation for management of prediabetes , hyperlipidemia, weight management requested by  Alveria Apley, NP.   Past Medical History:  Diagnosis Date   Anxiety    panic attacks   Depression    PP anxiety and depression   Excessive daytime sleepiness    Gestational diabetes    HSV-1 (herpes simplex virus 1) infection    IBS (irritable bowel syndrome)    not sure what caused it   Migraines    Obesity (BMI 30-39.9) 01/11/2019   OCD (obsessive compulsive disorder)    PCOS (polycystic ovarian syndrome)    Vaginal Pap smear, abnormal     Past Surgical History:  Procedure Laterality Date   TONSILLECTOMY     wisdom teeth       Social History   Socioeconomic History   Marital status: Married    Spouse name: Not on file   Number of children: 2   Years of education: Not on file   Highest education level: Associate degree: academic program  Occupational History   Not on file  Tobacco Use   Smoking status: Former    Current packs/day: 0.00    Types: Cigarettes    Start date: 01/2008    Quit date: 01/2013    Years since quitting: 10.2   Smokeless tobacco: Never   Tobacco comments:    3-5 cigarettes a day  Vaping Use   Vaping status: Never Used  Substance and Sexual Activity   Alcohol use: Yes    Comment: Once every 3 months.   Drug use: No   Sexual activity: Yes    Partners: Male    Birth control/protection: Condom, I.U.D.  Other Topics Concern   Not on file  Social History Narrative   Married 6 years,second. First marriage lasted 7 years. 2 dogs, 1 bunny.   Social Drivers of Corporate investment banker Strain: Not on file  Food Insecurity: Not on file  Transportation Needs: Not on  file  Physical Activity: Not on file  Stress: Not on file  Social Connections: Unknown (08/16/2021)   Received from [facility], [facility]   Social Network    Social Network: Not on file    Family History  Problem Relation Age of Onset   Diabetes Mother    Hypertension Mother    Hyperlipidemia Mother    Thyroid disease Mother    Pulmonary fibrosis Father    Hypertension Father    Hyperlipidemia Father    Hyperlipidemia Sister    Cancer Maternal Grandmother        melanoma   Parkinson's disease Maternal Grandfather    Heart disease Maternal Grandfather    Diabetes Maternal Grandfather    Cancer Paternal Grandmother        liver   Diabetes Paternal Grandmother    Heart disease Paternal Grandfather    COPD Paternal Grandfather     Outpatient Encounter Medications as  of 03/26/2023  Medication Sig   omeprazole (PRILOSEC) 10 MG capsule Take 10 mg by mouth daily.   tirzepatide (ZEPBOUND) 5 MG/0.5ML Pen Inject 5 mg into the skin once a week.   ALPRAZolam (XANAX XR) 1 MG 24 hr tablet Take 1 mg by mouth daily. prn   azelastine (ASTELIN) 0.1 % nasal spray Place 2 sprays into both nostrils 2 (two) times daily.   busPIRone (BUSPAR) 10 MG tablet Take 10 mg by mouth 2 (two) times daily.   Cetirizine HCl (ZYRTEC ALLERGY) 10 MG CAPS ZyrTEC   fluticasone (FLONASE) 50 MCG/ACT nasal spray SHAKE LIQUID AND USE 1 SPRAY IN EACH NOSTRIL TWICE DAILY AS NEEDED   Insulin Pen Needle 29G X MISC Use to inject Mounjaro weekly   Magnesium 100 MG CAPS Magnesium   metFORMIN (GLUCOPHAGE-XR) 500 MG 24 hr tablet Take 1 tablet (500 mg total) by mouth daily with breakfast.   Multiple Vitamin (MULTIVITAMIN ADULT PO) Take 1 tablet by mouth daily.   ondansetron (ZOFRAN-ODT) 4 MG disintegrating tablet Take 1 tablet (4 mg total) by mouth every 8 (eight) hours as needed for nausea or vomiting.   Probiotic Product (FORTIFY PROBIOTIC WOMENS EX ST PO) Take by mouth daily.   rizatriptan (MAXALT) 10 MG tablet  Take 1 tablet (10 mg total) by mouth daily as needed.   sertraline (ZOLOFT) 100 MG tablet Take 200 mg by mouth daily.   spironolactone (ALDACTONE) 50 MG tablet TAKE 1 TABLET(50 MG) BY MOUTH DAILY   [DISCONTINUED] tirzepatide (ZEPBOUND) 2.5 MG/0.5ML Pen Inject 2.5 mg into the skin once a week.   No facility-administered encounter medications on file as of 03/26/2023.    ALLERGIES: Allergies  Allergen Reactions   Advil [Ibuprofen] Anaphylaxis    Coated / dye    Cefazolin Other (See Comments)   Penicillin G Other (See Comments)   Vancomycin Hives    Hives on chest and back. Tingling around mouth.    Amoxicillin Rash   Penicillins Rash and Other (See Comments)    Has patient had a PCN reaction causing immediate rash, facial/tongue/throat swelling, SOB or lightheadedness with hypotension: Yes  Has patient had a PCN reaction causing severe rash involving mucus membranes or skin necrosis: No  Has patient had a PCN reaction that required hospitalization No  Has patient had a PCN reaction occurring within the last 10 years: No  If all of the above answers are "NO", then may proceed with Cephalosporin use.    VACCINATION STATUS: Immunization History  Administered Date(s) Administered   Influenza,inj,Quad PF,6+ Mos 01/01/2021   Influenza-Unspecified 02/02/2020   Moderna Sars-Covid-2 Vaccination 06/24/2019, 07/26/2019, 02/02/2020   Td 04/06/2016   Tdap 04/06/2012    Diabetes She presents for her follow-up diabetic visit. Diabetes type: Prediabetes. The initial diagnosis of diabetes was made 1 year ago. Her disease course has been improving. Pertinent negatives for hypoglycemia include no confusion, headaches, pallor or seizures. Pertinent negatives for diabetes include no chest pain, no fatigue, no polydipsia, no polyphagia and no polyuria. There are no hypoglycemic complications. Symptoms are improving. (She has medical history of PCOS, hyperlipidemia, obesity, sedentary lifestyle.)  Risk factors for coronary artery disease include dyslipidemia, family history, sedentary lifestyle and obesity (Prediabetes, metabolic syndrome, PCOS, gestational diabetes x 2.). Current diabetic treatments: Metformin 500 mg p.o. daily. Her weight is decreasing steadily. She has not had a previous visit with a dietitian. She rarely participates in exercise. Her home blood glucose trend is fluctuating minimally. (She does not monitor blood glucose  regularly.  Her recent point-of-care A1c is 6.4%.  Progressively worsening from 5.8%.   ) An ACE inhibitor/angiotensin II receptor blocker is not being taken.  Hyperlipidemia This is a chronic problem. Recent lipid tests were reviewed and are high. Exacerbating diseases include obesity. Pertinent negatives include no chest pain, myalgias or shortness of breath. She is currently on no antihyperlipidemic treatment. Risk factors for coronary artery disease include dyslipidemia, family history and obesity (Prediabetes, metabolic syndrome).     Review of Systems  Constitutional:  Negative for chills, fatigue, fever and unexpected weight change.  HENT:  Negative for trouble swallowing and voice change.   Eyes:  Negative for visual disturbance.  Respiratory:  Negative for cough, shortness of breath and wheezing.   Cardiovascular:  Negative for chest pain, palpitations and leg swelling.  Gastrointestinal:  Negative for diarrhea, nausea and vomiting.  Endocrine: Negative for cold intolerance, heat intolerance, polydipsia, polyphagia and polyuria.  Musculoskeletal:  Negative for arthralgias and myalgias.  Skin:  Negative for color change, pallor, rash and wound.  Neurological:  Negative for seizures and headaches.  Psychiatric/Behavioral:  Negative for confusion and suicidal ideas.     Objective:       03/26/2023    8:18 AM 02/25/2023    8:51 AM 11/26/2022    8:33 AM  Vitals with BMI  Height 5\' 7"  5\' 7"  5\' 7"   Weight 250 lbs 257 lbs 6 oz 251 lbs  BMI  39.15 40.31 39.3  Systolic 100 112 829  Diastolic 78 82 76  Pulse 76 68 72    BP 100/78   Pulse 76   Ht 5\' 7"  (1.702 m)   Wt 250 lb (113.4 kg)   BMI 39.16 kg/m   Wt Readings from Last 3 Encounters:  03/26/23 250 lb (113.4 kg)  02/25/23 257 lb 6.4 oz (116.8 kg)  11/26/22 251 lb (113.9 kg)     Diabetic Labs (most recent): Lab Results  Component Value Date   HGBA1C 6.4 02/25/2023   HGBA1C 5.8 11/26/2022   HGBA1C 6.1 06/17/2022   MICROALBUR 1.4 06/17/2022     Lipid Panel ( most recent) Lipid Panel     Component Value Date/Time   CHOL 184 02/16/2023 1409   TRIG 209 (H) 02/16/2023 1409   HDL 40 02/16/2023 1409   CHOLHDL 4.6 (H) 02/16/2023 1409   CHOLHDL 4 06/17/2022 0844   VLDL 30.0 06/17/2022 0844   LDLCALC 108 (H) 02/16/2023 1409   LDLCALC 82 08/27/2020 1559   LABVLDL 36 02/16/2023 1409     Lab Results  Component Value Date   TSH 2.520 02/16/2023   TSH 2.69 03/18/2021   TSH 0.04 (L) 08/27/2020   TSH 0.18 (L) 07/12/2019   TSH 0.94 01/11/2019   TSH 2.153 12/28/2013   FREET4 1.00 02/16/2023   FREET4 1.0 08/27/2020   FREET4 0.9 07/12/2019    Recent Results (from the past 2160 hours)  Comprehensive metabolic panel     Status: Abnormal   Collection Time: 02/16/23  2:09 PM  Result Value Ref Range   Glucose 105 (H) 70 - 99 mg/dL   BUN 12 6 - 24 mg/dL   Creatinine, Ser 5.62 0.57 - 1.00 mg/dL   eGFR 79 >13 YQ/MVH/8.46   BUN/Creatinine Ratio 13 9 - 23   Sodium 139 134 - 144 mmol/L   Potassium 4.6 3.5 - 5.2 mmol/L   Chloride 101 96 - 106 mmol/L   CO2 24 20 - 29 mmol/L   Calcium 9.3 8.7 -  10.2 mg/dL   Total Protein 7.3 6.0 - 8.5 g/dL   Albumin 4.2 3.9 - 4.9 g/dL   Globulin, Total 3.1 1.5 - 4.5 g/dL   Bilirubin Total <5.6 0.0 - 1.2 mg/dL   Alkaline Phosphatase 68 44 - 121 IU/L   AST 16 0 - 40 IU/L   ALT 18 0 - 32 IU/L  Lipid panel     Status: Abnormal   Collection Time: 02/16/23  2:09 PM  Result Value Ref Range   Cholesterol, Total 184 100 - 199 mg/dL    Triglycerides 213 (H) 0 - 149 mg/dL   HDL 40 >08 mg/dL   VLDL Cholesterol Cal 36 5 - 40 mg/dL   LDL Chol Calc (NIH) 657 (H) 0 - 99 mg/dL   Chol/HDL Ratio 4.6 (H) 0.0 - 4.4 ratio    Comment:                                   T. Chol/HDL Ratio                                             Men  Women                               1/2 Avg.Risk  3.4    3.3                                   Avg.Risk  5.0    4.4                                2X Avg.Risk  9.6    7.1                                3X Avg.Risk 23.4   11.0   TSH     Status: None   Collection Time: 02/16/23  2:09 PM  Result Value Ref Range   TSH 2.520 0.450 - 4.500 uIU/mL  T4, free     Status: None   Collection Time: 02/16/23  2:09 PM  Result Value Ref Range   Free T4 1.00 0.82 - 1.77 ng/dL  T3, free     Status: None   Collection Time: 02/16/23  2:09 PM  Result Value Ref Range   T3, Free 2.4 2.0 - 4.4 pg/mL  Thyroid peroxidase antibody     Status: Abnormal   Collection Time: 02/16/23  2:09 PM  Result Value Ref Range   Thyroperoxidase Ab SerPl-aCnc 38 (H) 0 - 34 IU/mL  Thyroglobulin antibody     Status: None   Collection Time: 02/16/23  2:09 PM  Result Value Ref Range   Thyroglobulin Antibody <1.0 0.0 - 0.9 IU/mL    Comment: Thyroglobulin Antibody measured by [employer] Methodology It should be noted that the presence of thyroglobulin antibodies may not be pathogenic nor diagnostic, especially at very low levels. The assay manufacturer has found that four percent of individuals without evidence of thyroid disease or autoimmunity will have positive TgAb levels up to 4 IU/mL.  VITAMIN D 25 Hydroxy (Vit-D Deficiency, Fractures)     Status: None   Collection Time: 02/16/23  2:09 PM  Result Value Ref Range   Vit D, 25-Hydroxy 44.5 30.0 - 100.0 ng/mL    Comment: Vitamin D deficiency has been defined by the Institute of Medicine and an Endocrine Society practice guideline as a level of serum 25-OH vitamin D less than 20  ng/mL (1,2). The Endocrine Society went on to further define vitamin D insufficiency as a level between 21 and 29 ng/mL (2). 1. IOM (Institute of Medicine). 2010. Dietary reference    intakes for calcium and D. [place]: The    [employer]. 2. Holick MF, Binkley Pershing, Bischoff-Ferrari HA, et al.    Evaluation, treatment, and prevention of vitamin D    deficiency: an Endocrine Society clinical practice    guideline. JCEM. 2011 Jul; 96(7):1911-30.   Vitamin B12     Status: None   Collection Time: 02/16/23  2:09 PM  Result Value Ref Range   Vitamin B-12 705 232 - 1,245 pg/mL  Insulin and C-Peptide     Status: Abnormal   Collection Time: 02/16/23  2:09 PM  Result Value Ref Range   INSULIN 92.1 (H) 2.6 - 24.9 uIU/mL   C-Peptide 12.0 (H) 1.1 - 4.4 ng/mL    Comment: C-Peptide reference interval is for fasting patients.  HgB A1c     Status: None   Collection Time: 02/25/23 10:18 AM  Result Value Ref Range   Hemoglobin A1C     HbA1c POC (<> result, manual entry)     HbA1c, POC (prediabetic range)     HbA1c, POC (controlled diabetic range) 6.4 0.0 - 7.0 %     Assessment & Plan:   1. Prediabetes 2. Mixed hyperlipidemia 3. BMI 39.0-39.9,adult 4. Hypervitaminosis D   - Lisa Howe has currently controlled symptomatic prediabetes with recent point-of-care A1c of 6.4% increasing from 5.8%.  Her previous labs were discussed with her which includes hyperinsulinemia indicative of insulin resistance.  She remains on metformin 500 mg XR p.o. daily.  She was also initiated on Zepbound at 2.5 mg subcutaneously weekly.  She presents with 7 pounds weight loss.  She does not report any side effects from this medication.    I had a long discussion with her about the additive and synergistic risk factors for cardiovascular disease.  Patient has prior history of gestational diabetes x 2, and medical history of PCOS.    -She has additional  risk factors including high BMI,  hyperlipidemia, semi-sedentary lifestyle and she remains at a high risk for more acute and chronic complications which include CAD, CVA, CKD, retinopathy, and neuropathy. These are all discussed in detail with her.  Metabolic syndrome with multiple manifestations  makes her a Perfect candidate for lifestyle medicine.  -She would benefit from early intervention with pharmaceuticals to help her achieve weight loss.  I discussed and increased Zepbound to 5 mg subcutaneously weekly, will advance per protocol slowly as she tolerates.    - I discussed all available options of managing her pre-diabetes including de-escalation of medications. I have counseled her on Food as Medicine by adopting a Whole Food , Plant Predominant  ( WFPP) nutrition as recommended by [employer] of Lifestyle Medicine. Patient is encouraged to switch to  unprocessed or minimally processed  complex starch, adequate protein intake (mainly plant source), minimal liquid fat, plenty of fruits, and vegetables. -  she is advised to stick to a  routine mealtimes to eat 3 complete meals a day and snack only when necessary ( to snack only to correct hypoglycemia BG <70 day time or <100 at night).   - she acknowledges that there is a room for improvement in her food and drink choices. - Suggestion is made for her to avoid simple carbohydrates  from her diet including Cakes, Sweet Desserts, Ice Cream, Soda (diet and regular), Sweet Tea, Candies, Chips, Cookies, Store Bought Juices, Alcohol , Artificial Sweeteners,  Coffee Creamer, and "Sugar-free" Products, Lemonade. This will help patient to have more stable blood glucose profile and potentially avoid unintended weight gain.  The following Lifestyle Medicine recommendations according to American College of Lifestyle Medicine  [facility]) were discussed and and offered to patient and she  agrees to start the journey:  A. Whole Foods, Plant-Based Nutrition comprising of fruits and vegetables,  plant-based proteins, whole-grain carbohydrates was discussed in detail with the patient.   A list for source of those nutrients were also provided to the patient.  Patient will use only water or unsweetened tea for hydration. B.  The need to stay away from risky substances including alcohol, smoking; obtaining 7 to 9 hours of restorative sleep, at least 150 minutes of moderate intensity exercise weekly, the importance of healthy social connections,  and stress management techniques were discussed. C.  A full color page of  Calorie density of various food groups per pound showing examples of each food groups was provided to the patient.    -She is advised to continue metformin 500 mg XR p.o. daily at breakfast.   -She has euthyroid state, will not need any thyroid hormone replacement. -Vitamin D has dropped to appropriate levels at 44.  She may need maintenance dose vitamin D with 2000 units daily. She is also on multivitamin supplements.  - Specific targets for  A1c;  LDL, HDL,  and Triglycerides were discussed with the patient.  2) Blood Pressure /Hypertension:  Her blood pressure is controlled to target.  She is advised to continue her current blood pressure medications including spironolactone 50 mg p.o. daily which appears to have been initiated because of her history of PCOS.     3) Lipids/Hyperlipidemia:   Review of her recent lipid panel showed uncontrolled  LDL at 108 .  she  is not on statins.  Whole food plant-based diet discussed above will help with dyslipidemia.  She will have fasting lipid panel before her next visit, if her next measurement of LDL is greater than 100 mg per DL, she will be considered for low-dose statin.     4)  Weight/Diet:  Body mass index is 39.16 kg/m.  -   clearly complicating her cardiometabolic health.  she is  a candidate for weight loss. I discussed with her the fact that loss of 5 - 10% of her  current body weight will have the most impact on her  diabetes management.  The above detailed  ACLM recommendations for nutrition, exercise, sleep, social life, avoidance of risky substances, the need for restorative sleep   information will also detailed on discharge instructions.   5) Chronic Care/Health Maintenance:  -she  is not  on ACEI/ARB and Statin medications and  is encouraged to initiate and continue to follow up with Ophthalmology, Dentist,  Podiatrist at least yearly or according to recommendations, and advised to  stay away from smoking. I have recommended yearly flu vaccine and pneumonia vaccine at least every 5 years; moderate intensity exercise for  up to 150 minutes weekly; and  sleep for 7- 9 hours a day.  - she is  advised to maintain close follow up with Alveria Apley, NP for primary care needs, as well as her other providers for optimal and coordinated care.    I spent  25  minutes in the care of the patient today including review of labs from Thyroid Function, CMP, and other relevant labs ; imaging/biopsy records (current and previous including abstractions from other facilities); face-to-face time discussing  her lab results and symptoms, medications doses, her options of short and long term treatment based on the latest standards of care / guidelines;   and documenting the encounter.  Colin Mulders  participated in the discussions, expressed understanding, and voiced agreement with the above plans.  All questions were answered to her satisfaction. she is encouraged to contact clinic should she have any questions or concerns prior to her return visit.    Follow up plan: - Return in about 3 months (around 06/24/2023) for Fasting Labs  in AM B4 8, A1c -NV.  Marquis Lunch, MD [facility] [address] [place], [place] [address] Phone: [phone]  Fax: [phone]    03/26/2023, 9:28 AM  This note was partially dictated with voice recognition software. Similar  sounding words can be transcribed inadequately or may not  be corrected upon review.

## 2023-03-26 NOTE — Patient Instructions (Signed)

## 2023-04-01 ENCOUNTER — Telehealth: Payer: Self-pay

## 2023-04-01 NOTE — Telephone Encounter (Signed)
Pt called stating her Rx for Zepbound 5mg  needs prior authorization.

## 2023-04-02 ENCOUNTER — Telehealth: Payer: Self-pay

## 2023-04-02 ENCOUNTER — Other Ambulatory Visit (HOSPITAL_COMMUNITY): Payer: Self-pay

## 2023-04-02 NOTE — Telephone Encounter (Signed)
Noted  

## 2023-04-02 NOTE — Telephone Encounter (Signed)
*  Endo  Pharmacy Patient Advocate Encounter   Received notification from Pt Calls Messages that prior authorization for Zepbound 5MG /0.5ML pen-injectors  is required/requested.   Insurance verification completed.   The patient is insured through [facility] .   Per test claim: PA required; PA submitted to above mentioned insurance via CoverMyMeds Key/confirmation #/EOC [id number] Status is pending

## 2023-04-02 NOTE — Telephone Encounter (Signed)
PA request has been Submitted. New Encounter created for follow up. For additional info see Pharmacy Prior Auth telephone encounter from 12/27.

## 2023-04-07 ENCOUNTER — Other Ambulatory Visit: Payer: Self-pay | Admitting: Family Medicine

## 2023-04-07 DIAGNOSIS — E119 Type 2 diabetes mellitus without complications: Secondary | ICD-10-CM | POA: Diagnosis not present

## 2023-04-07 DIAGNOSIS — R7303 Prediabetes: Secondary | ICD-10-CM

## 2023-04-14 ENCOUNTER — Other Ambulatory Visit (HOSPITAL_COMMUNITY): Payer: Self-pay

## 2023-04-14 NOTE — Telephone Encounter (Signed)
 Do we have any updates on PA for Zepbound?

## 2023-04-15 NOTE — Telephone Encounter (Signed)
Noted. Pt made aware.

## 2023-04-27 ENCOUNTER — Telehealth: Payer: Self-pay

## 2023-04-27 NOTE — Telephone Encounter (Signed)
Pharmacy Patient Advocate Encounter  Received notification from [facility] that Prior Authorization for Zepbound has been DENIED.  Full denial letter will be uploaded to the media tab. See denial reason below.   PA #/Case ID/Reference #: [id number]

## 2023-04-27 NOTE — Telephone Encounter (Signed)
Pt called stating her insurance plan will no longer cover GLP1 medication. Asked what other options you recommend.

## 2023-04-28 NOTE — Telephone Encounter (Signed)
Pt states her insurance plan changed with the new year and they will not cover any GLP1.

## 2023-05-05 ENCOUNTER — Other Ambulatory Visit: Payer: Self-pay | Admitting: "Endocrinology

## 2023-05-05 MED ORDER — TIRZEPATIDE-WEIGHT MANAGEMENT 5 MG/0.5ML ~~LOC~~ SOLN: 5.0000 mg | SUBCUTANEOUS | 3 refills | Status: DC

## 2023-05-08 DIAGNOSIS — E119 Type 2 diabetes mellitus without complications: Secondary | ICD-10-CM | POA: Diagnosis not present

## 2023-05-10 ENCOUNTER — Ambulatory Visit (INDEPENDENT_AMBULATORY_CARE_PROVIDER_SITE_OTHER): Payer: BC Managed Care – PPO | Admitting: Podiatry

## 2023-05-10 ENCOUNTER — Encounter: Payer: Self-pay | Admitting: Podiatry

## 2023-05-10 DIAGNOSIS — L600 Ingrowing nail: Secondary | ICD-10-CM | POA: Diagnosis not present

## 2023-05-10 NOTE — Progress Notes (Signed)
Chief Complaint  Patient presents with   Ingrown Toenail    Patient states she has an in grown toe nail on left hallux, patient left hallux has some leaking to it, states it is very painful even when her blanket touches her toe. It  started yesterday , no medication for pain.    Subjective: Patient presents today for evaluation of pain to the lateral border left great toe. Patient is concerned for possible ingrown nail.  It is very sensitive to touch.  Patient presents today for further treatment and evaluation.  Past Medical History:  Diagnosis Date   Anxiety    panic attacks   Depression    PP anxiety and depression   Excessive daytime sleepiness    Gestational diabetes    HSV-1 (herpes simplex virus 1) infection    IBS (irritable bowel syndrome)    not sure what caused it   Migraines    Obesity (BMI 30-39.9) 01/11/2019   OCD (obsessive compulsive disorder)    PCOS (polycystic ovarian syndrome)    Vaginal Pap smear, abnormal     Past Surgical History:  Procedure Laterality Date   TONSILLECTOMY     wisdom teeth       Allergies  Allergen Reactions   Advil [Ibuprofen] Anaphylaxis    Coated / dye    Cefazolin Other (See Comments)   Penicillin G Other (See Comments)   Vancomycin Hives    Hives on chest and back. Tingling around mouth.    Amoxicillin Rash   Penicillins Rash and Other (See Comments)    Has patient had a PCN reaction causing immediate rash, facial/tongue/throat swelling, SOB or lightheadedness with hypotension: Yes  Has patient had a PCN reaction causing severe rash involving mucus membranes or skin necrosis: No  Has patient had a PCN reaction that required hospitalization No  Has patient had a PCN reaction occurring within the last 10 years: No  If all of the above answers are "NO", then may proceed with Cephalosporin use.    Objective:  General: Well developed, nourished, in no acute distress, alert and oriented x3   Dermatology: Skin is warm,  dry and supple bilateral.  Lateral border left great toe is tender with evidence of an ingrowing nail. Pain on palpation noted to the border of the nail fold. The remaining nails appear unremarkable at this time.   Vascular: DP and PT pulses palpable.  No clinical evidence of vascular compromise  Neruologic: Grossly intact via light touch bilateral.  Musculoskeletal: No pedal deformity noted  Assesement: #1 Paronychia with ingrowing nail lateral border left great toe  Plan of Care:  -Patient evaluated.  -Discussed treatment alternatives and plan of care. Explained nail avulsion procedure and post procedure course to patient. -Patient opted for permanent partial nail avulsion of the ingrown portion of the nail.  -Prior to procedure, local anesthesia infiltration utilized using 3 ml of a 50:50 mixture of 2% plain lidocaine and 0.5% plain marcaine in a normal hallux block fashion and a betadine prep performed.  -Partial permanent nail avulsion with chemical matrixectomy performed using 3x30sec applications of phenol followed by alcohol flush.  -Light dressing applied.  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 Felecia Shelling, DPM    [address]  [place], [place] [address]                Office [phone]  Fax [phone]

## 2023-05-11 ENCOUNTER — Other Ambulatory Visit: Payer: Self-pay

## 2023-05-11 MED ORDER — TIRZEPATIDE-WEIGHT MANAGEMENT 5 MG/0.5ML ~~LOC~~ SOLN: 5.0000 mg | SUBCUTANEOUS | 3 refills | Status: DC

## 2023-05-13 NOTE — Telephone Encounter (Signed)
 Noted.

## 2023-05-24 ENCOUNTER — Ambulatory Visit: Payer: BC Managed Care – PPO | Admitting: Podiatry

## 2023-06-05 DIAGNOSIS — E119 Type 2 diabetes mellitus without complications: Secondary | ICD-10-CM | POA: Diagnosis not present

## 2023-06-21 DIAGNOSIS — R7303 Prediabetes: Secondary | ICD-10-CM | POA: Diagnosis not present

## 2023-06-21 DIAGNOSIS — E782 Mixed hyperlipidemia: Secondary | ICD-10-CM | POA: Diagnosis not present

## 2023-06-22 LAB — LIPID PANEL
Chol/HDL Ratio: 4.2 ratio (ref 0.0–4.4)
Cholesterol, Total: 175 mg/dL (ref 100–199)
HDL: 42 mg/dL (ref >39)
LDL Chol Calc (NIH): 104 mg/dL — ABNORMAL HIGH (ref 0–99)
Triglycerides: 163 mg/dL — ABNORMAL HIGH (ref 0–149)
VLDL Cholesterol Cal: 29 mg/dL (ref 5–40)

## 2023-06-22 LAB — INSULIN AND C-PEPTIDE, SERUM
C-Peptide: 8.1 ng/mL — ABNORMAL HIGH (ref 1.1–4.4)
INSULIN: 68.6 u[IU]/mL — ABNORMAL HIGH (ref 2.6–24.9)

## 2023-06-24 ENCOUNTER — Encounter: Payer: Self-pay | Admitting: "Endocrinology

## 2023-06-24 ENCOUNTER — Ambulatory Visit (INDEPENDENT_AMBULATORY_CARE_PROVIDER_SITE_OTHER): Payer: BC Managed Care – PPO | Admitting: "Endocrinology

## 2023-06-24 VITALS — BP 96/74 | HR 88 | Ht 67.0 in | Wt 238.8 lb

## 2023-06-24 DIAGNOSIS — R7303 Prediabetes: Secondary | ICD-10-CM | POA: Diagnosis not present

## 2023-06-24 DIAGNOSIS — E161 Other hypoglycemia: Secondary | ICD-10-CM | POA: Diagnosis not present

## 2023-06-24 DIAGNOSIS — E782 Mixed hyperlipidemia: Secondary | ICD-10-CM | POA: Diagnosis not present

## 2023-06-24 DIAGNOSIS — E673 Hypervitaminosis D: Secondary | ICD-10-CM

## 2023-06-24 LAB — POCT GLYCOSYLATED HEMOGLOBIN (HGB A1C): HbA1c, POC (prediabetic range): 5.4 % — AB (ref 5.7–6.4)

## 2023-06-24 NOTE — Progress Notes (Signed)
 06/24/2023, 11:10 AM   Endocrinology follow-up note  Subjective:    Patient ID: Lisa Howe, female    DOB: Aug 14, 1980.  Lisa Howe is being seen in follow-up after she was seen in consultation for management of prediabetes , hyperlipidemia, weight management requested by  Alveria Apley, NP.   Past Medical History:  Diagnosis Date   Anxiety    panic attacks   Depression    PP anxiety and depression   Excessive daytime sleepiness    Gestational diabetes    HSV-1 (herpes simplex virus 1) infection    IBS (irritable bowel syndrome)    not sure what caused it   Migraines    Obesity (BMI 30-39.9) 01/11/2019   OCD (obsessive compulsive disorder)    PCOS (polycystic ovarian syndrome)    Vaginal Pap smear, abnormal     Past Surgical History:  Procedure Laterality Date   TONSILLECTOMY     wisdom teeth       Social History   Socioeconomic History   Marital status: Married    Spouse name: Not on file   Number of children: 2   Years of education: Not on file   Highest education level: Associate degree: academic program  Occupational History   Not on file  Tobacco Use   Smoking status: Former    Current packs/day: 0.00    Types: Cigarettes    Start date: 01/2008    Quit date: 01/2013    Years since quitting: 10.4   Smokeless tobacco: Never   Tobacco comments:    3-5 cigarettes a day  Vaping Use   Vaping status: Never Used  Substance and Sexual Activity   Alcohol use: Yes    Comment: Once every 3 months.   Drug use: No   Sexual activity: Yes    Partners: Male    Birth control/protection: Condom, I.U.D.  Other Topics Concern   Not on file  Social History Narrative   Married 6 years,second. First marriage lasted 7 years. 2 dogs, 1 bunny.   Social Drivers of Corporate investment banker Strain: Not on file  Food Insecurity: Not on file  Transportation Needs: Not on  file  Physical Activity: Not on file  Stress: Not on file  Social Connections: Unknown (08/16/2021)   Received from [facility]   Social Network    Social Network: Not on file    Family History  Problem Relation Age of Onset   Diabetes Mother    Hypertension Mother    Hyperlipidemia Mother    Thyroid disease Mother    Pulmonary fibrosis Father    Hypertension Father    Hyperlipidemia Father    Hyperlipidemia Sister    Cancer Maternal Grandmother        melanoma   Parkinson's disease Maternal Grandfather    Heart disease Maternal Grandfather    Diabetes Maternal Grandfather    Cancer Paternal Grandmother        liver   Diabetes Paternal Grandmother    Heart disease Paternal Grandfather    COPD Paternal Grandfather     Outpatient Encounter Medications as  of 06/24/2023  Medication Sig   ALPRAZolam (XANAX XR) 1 MG 24 hr tablet Take 1 mg by mouth daily. prn   azelastine (ASTELIN) 0.1 % nasal spray Place 2 sprays into both nostrils 2 (two) times daily.   busPIRone (BUSPAR) 10 MG tablet Take 10 mg by mouth 2 (two) times daily.   Cetirizine HCl (ZYRTEC ALLERGY) 10 MG CAPS ZyrTEC   fluticasone (FLONASE) 50 MCG/ACT nasal spray SHAKE LIQUID AND USE 1 SPRAY IN EACH NOSTRIL TWICE DAILY AS NEEDED   Insulin Pen Needle 29G X MISC Use to inject Mounjaro weekly   Magnesium 100 MG CAPS Magnesium   metFORMIN (GLUCOPHAGE-XR) 500 MG 24 hr tablet Take 1 tablet (500 mg total) by mouth daily with breakfast.   Multiple Vitamin (MULTIVITAMIN ADULT PO) Take 1 tablet by mouth daily.   omeprazole (PRILOSEC) 10 MG capsule Take 10 mg by mouth daily.   ondansetron (ZOFRAN-ODT) 4 MG disintegrating tablet Take 1 tablet (4 mg total) by mouth every 8 (eight) hours as needed for nausea or vomiting.   Probiotic Product (FORTIFY PROBIOTIC WOMENS EX ST PO) Take by mouth daily.   rizatriptan (MAXALT) 10 MG tablet Take 1 tablet (10 mg total) by mouth daily as needed.   sertraline (ZOLOFT) 100  MG tablet Take 200 mg by mouth daily.   spironolactone (ALDACTONE) 50 MG tablet TAKE 1 TABLET(50 MG) BY MOUTH DAILY   tirzepatide 5 MG/0.5ML injection vial Inject 5 mg into the skin once a week.   No facility-administered encounter medications on file as of 06/24/2023.    ALLERGIES: Allergies  Allergen Reactions   Advil [Ibuprofen] Anaphylaxis    Coated / dye    Cefazolin Other (See Comments)   Penicillin G Other (See Comments)   Vancomycin Hives    Hives on chest and back. Tingling around mouth.    Amoxicillin Rash   Penicillins Rash and Other (See Comments)    Has patient had a PCN reaction causing immediate rash, facial/tongue/throat swelling, SOB or lightheadedness with hypotension: Yes  Has patient had a PCN reaction causing severe rash involving mucus membranes or skin necrosis: No  Has patient had a PCN reaction that required hospitalization No  Has patient had a PCN reaction occurring within the last 10 years: No  If all of the above answers are "NO", then may proceed with Cephalosporin use.    VACCINATION STATUS: Immunization History  Administered Date(s) Administered   Influenza,inj,Quad PF,6+ Mos 01/01/2021   Influenza-Unspecified 02/02/2020   Moderna Sars-Covid-2 Vaccination 06/24/2019, 07/26/2019, 02/02/2020   Td 04/06/2016   Tdap 04/06/2012    Diabetes She presents for her follow-up diabetic visit. Diabetes type: Prediabetes. The initial diagnosis of diabetes was made 1 year ago. Her disease course has been improving. Pertinent negatives for hypoglycemia include no confusion, headaches, pallor or seizures. Pertinent negatives for diabetes include no chest pain, no fatigue, no polydipsia, no polyphagia and no polyuria. There are no hypoglycemic complications. Symptoms are improving. (She has medical history of PCOS, hyperlipidemia, obesity, sedentary lifestyle.) Risk factors for coronary artery disease include dyslipidemia, family history, sedentary lifestyle and  obesity (Prediabetes, metabolic syndrome, PCOS, gestational diabetes x 2.). Current diabetic treatments: Metformin 500 mg p.o. daily. Her weight is decreasing steadily (Patient presents with almost 20 pounds of weight loss overall.). She has not had a previous visit with a dietitian. She rarely participates in exercise. Her home blood glucose trend is decreasing steadily. (She does not monitor blood glucose regularly.  Her point-of-care A1c is improved  to 5.4% from 6.4%.  ) An ACE inhibitor/angiotensin II receptor blocker is not being taken.  Hyperlipidemia This is a chronic problem. Recent lipid tests were reviewed and are high. She has no history of obesity. Pertinent negatives include no chest pain, myalgias or shortness of breath. She is currently on no antihyperlipidemic treatment. Risk factors for coronary artery disease include dyslipidemia, family history and obesity (Prediabetes, metabolic syndrome).     Review of Systems  Constitutional:  Negative for chills, fatigue, fever and unexpected weight change.  HENT:  Negative for trouble swallowing and voice change.   Eyes:  Negative for visual disturbance.  Respiratory:  Negative for cough, shortness of breath and wheezing.   Cardiovascular:  Negative for chest pain, palpitations and leg swelling.  Gastrointestinal:  Negative for diarrhea, nausea and vomiting.  Endocrine: Negative for cold intolerance, heat intolerance, polydipsia, polyphagia and polyuria.  Musculoskeletal:  Negative for arthralgias and myalgias.  Skin:  Negative for color change, pallor, rash and wound.  Neurological:  Negative for seizures and headaches.  Psychiatric/Behavioral:  Negative for confusion and suicidal ideas.     Objective:       06/24/2023    8:31 AM 03/26/2023    8:18 AM 02/25/2023    8:51 AM  Vitals with BMI  Height 5\' 7"  5\' 7"  5\' 7"   Weight 238 lbs 13 oz 250 lbs 257 lbs 6 oz  BMI 37.39 39.15 40.31  Systolic 96 100 112  Diastolic 74 78 82   Pulse 88 76 68    BP 96/74   Pulse 88   Ht 5\' 7"  (1.702 m)   Wt 238 lb 12.8 oz (108.3 kg)   BMI 37.40 kg/m   Wt Readings from Last 3 Encounters:  06/24/23 238 lb 12.8 oz (108.3 kg)  03/26/23 250 lb (113.4 kg)  02/25/23 257 lb 6.4 oz (116.8 kg)     Diabetic Labs (most recent): Lab Results  Component Value Date   HGBA1C 5.4 (A) 06/24/2023   HGBA1C 6.4 02/25/2023   HGBA1C 5.8 11/26/2022   MICROALBUR 1.4 06/17/2022     Lipid Panel ( most recent) Lipid Panel     Component Value Date/Time   CHOL 175 06/21/2023 1200   TRIG 163 (H) 06/21/2023 1200   HDL 42 06/21/2023 1200   CHOLHDL 4.2 06/21/2023 1200   CHOLHDL 4 06/17/2022 0844   VLDL 30.0 06/17/2022 0844   LDLCALC 104 (H) 06/21/2023 1200   LDLCALC 82 08/27/2020 1559   LABVLDL 29 06/21/2023 1200     Lab Results  Component Value Date   TSH 2.520 02/16/2023   TSH 2.69 03/18/2021   TSH 0.04 (L) 08/27/2020   TSH 0.18 (L) 07/12/2019   TSH 0.94 01/11/2019   TSH 2.153 12/28/2013   FREET4 1.00 02/16/2023   FREET4 1.0 08/27/2020   FREET4 0.9 07/12/2019    Recent Results (from the past 2160 hours)  Insulin and C-Peptide     Status: Abnormal   Collection Time: 06/21/23 12:00 PM  Result Value Ref Range   INSULIN 68.6 (H) 2.6 - 24.9 uIU/mL   C-Peptide 8.1 (H) 1.1 - 4.4 ng/mL    Comment: C-Peptide reference interval is for fasting patients.  Lipid panel     Status: Abnormal   Collection Time: 06/21/23 12:00 PM  Result Value Ref Range   Cholesterol, Total 175 100 - 199 mg/dL   Triglycerides 010 (H) 0 - 149 mg/dL   HDL 42 >27 mg/dL   VLDL Cholesterol Cal 29 5 -  40 mg/dL   LDL Chol Calc (NIH) 756 (H) 0 - 99 mg/dL   Chol/HDL Ratio 4.2 0.0 - 4.4 ratio    Comment:                                   T. Chol/HDL Ratio                                             Men  Women                               1/2 Avg.Risk  3.4    3.3                                   Avg.Risk  5.0    4.4                                2X Avg.Risk   9.6    7.1                                3X Avg.Risk 23.4   11.0   HgB A1c     Status: Abnormal   Collection Time: 06/24/23  8:42 AM  Result Value Ref Range   Hemoglobin A1C     HbA1c POC (<> result, manual entry)     HbA1c, POC (prediabetic range) 5.4 (A) 5.7 - 6.4 %   HbA1c, POC (controlled diabetic range)       Assessment & Plan:   1. Prediabetes/hyperinsulinemia-improving 2. Mixed hyperlipidemia 3. BMI 39.0-39.9,adult 4. Hypervitaminosis D   - Lisa Howe has currently controlled asymptomatic prediabetes with point-of-care A1c of 5.4% improving from 6.4%.  She is responding to Zepbound currently 5 mg subcutaneously weekly.  Her previous labs were discussed with her which includes hyperinsulinemia indicative of insulin resistance.  Her total insulin and C-peptide are decreasing progressively. She remains on metformin 500 mg XR p.o. daily.  Her GLP-1 receptor agonist will be advanced as tolerated, her next dose will be 7.5 mg subcutaneously weekly.  She does not report any side effects from this medication.  She presents with approximately 10% of body weight loss which you have significant impact on her cardiometabolic health. Patient has prior history of gestational diabetes x 2, and medical history of PCOS.    -She has additional  risk factors including high BMI, hyperlipidemia, semi-sedentary lifestyle and she remains at a high risk for more acute and chronic complications which include CAD, CVA, CKD, retinopathy, and neuropathy. These are all discussed in detail with her.  Metabolic syndrome with multiple manifestations  makes her a Perfect candidate for lifestyle medicine.  -She would also continue to benefit from pharmaceutical intervention as discussed above.   - I discussed all available options of managing her pre-diabetes including de-escalation of medications. I have counseled her on Food as Medicine by adopting a Whole Food , Plant Predominant  ( WFPP) nutrition as  recommended by [employer] of Lifestyle Medicine. Patient is encouraged to switch to  unprocessed or minimally  processed  complex starch, adequate protein intake (mainly plant source), minimal liquid fat, plenty of fruits, and vegetables. -  she is advised to stick to a routine mealtimes to eat 3 complete meals a day and snack only when necessary ( to snack only to correct hypoglycemia BG <70 day time or <100 at night).   - she acknowledges that there is a room for improvement in her food and drink choices. - Suggestion is made for her to avoid simple carbohydrates  from her diet including Cakes, Sweet Desserts, Ice Cream, Soda (diet and regular), Sweet Tea, Candies, Chips, Cookies, Store Bought Juices, Alcohol , Artificial Sweeteners,  Coffee Creamer, and "Sugar-free" Products, Lemonade. This will help patient to have more stable blood glucose profile and potentially avoid unintended weight gain.  The following Lifestyle Medicine recommendations according to American College of Lifestyle Medicine  [facility]) were discussed and and offered to patient and she  agrees to start the journey:  A. Whole Foods, Plant-Based Nutrition comprising of fruits and vegetables, plant-based proteins, whole-grain carbohydrates was discussed in detail with the patient.   A list for source of those nutrients were also provided to the patient.  Patient will use only water or unsweetened tea for hydration. B.  The need to stay away from risky substances including alcohol, smoking; obtaining 7 to 9 hours of restorative sleep, at least 150 minutes of moderate intensity exercise weekly, the importance of healthy social connections,  and stress management techniques were discussed. C.  A full color page of  Calorie density of various food groups per pound showing examples of each food groups was provided to the patient.    -She is advised to continue metformin 500 mg XR p.o. daily at breakfast.   -She has euthyroid state,  will not need any thyroid hormone replacement. -Vitamin D has dropped to appropriate levels at 44.  She may need maintenance dose vitamin D with 2000 units daily. She is also on multivitamin supplements.  - Specific targets for  A1c;  LDL, HDL,  and Triglycerides were discussed with the patient.  2) Blood Pressure /Hypertension:  -Her blood pressure is controlled to target.  She remains on spironolactone 50 mg p.o. daily  which appears to have been initiated because of her history of PCOS.     3) Lipids/Hyperlipidemia:   Review of her recent lipid panel showed uncontrolled  LDL at 104 .  she  is not on statins.  Whole food plant-based diet discussed above will help with dyslipidemia.  She will have fasting lipid panel before her next visit, if her next measurement of LDL is greater than 100 mg per DL, she will be considered for low-dose statin.     4)  Weight/Diet:  Body mass index is 37.4 kg/m.  -Presents with 10% of body weight loss which will help significantly as discussed above.  She is still a candidate for some more weight loss.   The above detailed  ACLM recommendations for nutrition, exercise, sleep, social life, avoidance of risky substances, the need for restorative sleep   information will also detailed on discharge instructions.   5) Chronic Care/Health Maintenance:  -she  is not  on ACEI/ARB and Statin medications and  is encouraged to initiate and continue to follow up with Ophthalmology, Dentist,  Podiatrist at least yearly or according to recommendations, and advised to  stay away from smoking. I have recommended yearly flu vaccine and pneumonia vaccine at least every 5 years; moderate intensity exercise  for up to 150 minutes weekly; and  sleep for 7- 9 hours a day.  - she is  advised to maintain close follow up with Alveria Apley, NP for primary care needs, as well as her other providers for optimal and coordinated care.   I spent  26  minutes in the care of the  patient today including review of labs from Thyroid Function, CMP, and other relevant labs ; imaging/biopsy records (current and previous including abstractions from other facilities); face-to-face time discussing  her lab results and symptoms, medications doses, her options of short and long term treatment based on the latest standards of care / guidelines;   and documenting the encounter.  Colin Mulders  participated in the discussions, expressed understanding, and voiced agreement with the above plans.  All questions were answered to her satisfaction. she is encouraged to contact clinic should she have any questions or concerns prior to her return visit.   Follow up plan: - Return in about 6 months (around 12/25/2023) for Fasting Labs  in AM B4 8, A1c -NV.  Marquis Lunch, MD [facility] [address] [place], [place] [address] Phone: [phone]  Fax: [phone]    06/24/2023, 11:10 AM  This note was partially dictated with voice recognition software. Similar sounding words can be transcribed inadequately or may not  be corrected upon review.

## 2023-07-06 ENCOUNTER — Other Ambulatory Visit: Payer: Self-pay | Admitting: Family Medicine

## 2023-07-06 DIAGNOSIS — E282 Polycystic ovarian syndrome: Secondary | ICD-10-CM

## 2023-07-06 DIAGNOSIS — E119 Type 2 diabetes mellitus without complications: Secondary | ICD-10-CM | POA: Diagnosis not present

## 2023-07-14 DIAGNOSIS — N921 Excessive and frequent menstruation with irregular cycle: Secondary | ICD-10-CM | POA: Diagnosis not present

## 2023-07-14 DIAGNOSIS — Z780 Asymptomatic menopausal state: Secondary | ICD-10-CM | POA: Diagnosis not present

## 2023-08-05 DIAGNOSIS — E119 Type 2 diabetes mellitus without complications: Secondary | ICD-10-CM | POA: Diagnosis not present

## 2023-08-09 ENCOUNTER — Other Ambulatory Visit: Payer: Self-pay

## 2023-08-09 DIAGNOSIS — Z6839 Body mass index (BMI) 39.0-39.9, adult: Secondary | ICD-10-CM

## 2023-08-09 MED ORDER — ZEPBOUND 7.5 MG/0.5ML ~~LOC~~ SOLN: 7.5000 mg | SUBCUTANEOUS | 1 refills | Status: DC

## 2023-09-05 DIAGNOSIS — E119 Type 2 diabetes mellitus without complications: Secondary | ICD-10-CM | POA: Diagnosis not present

## 2023-09-07 ENCOUNTER — Ambulatory Visit (INDEPENDENT_AMBULATORY_CARE_PROVIDER_SITE_OTHER): Admitting: Family Medicine

## 2023-09-07 ENCOUNTER — Encounter: Payer: Self-pay | Admitting: Family Medicine

## 2023-09-07 VITALS — BP 116/72 | HR 70 | Temp 98.2°F | Ht 67.0 in | Wt 229.0 lb

## 2023-09-07 DIAGNOSIS — H6501 Acute serous otitis media, right ear: Secondary | ICD-10-CM | POA: Diagnosis not present

## 2023-09-07 MED ORDER — AZITHROMYCIN 250 MG PO TABS: ORAL_TABLET | ORAL | 0 refills | Status: AC

## 2023-09-07 NOTE — Patient Instructions (Addendum)
-  It was a pleasure to care for you today.  -Prescribed Azithromycin  250mg  tablet, take 2 tablets today and 1 tablet 2nd through 5th day for right ear infection.  -Recommend to use over the counter Flonase to help clear eustachian tubes.  -Follow up if not improved.

## 2023-09-07 NOTE — Progress Notes (Signed)
   Acute Office Visit   Subjective:  Patient ID: Lisa Howe, female    DOB: [birth year], 43 y.o.   MRN: [id number]  Chief Complaint  Patient presents with   Acute Visit    Ear pain,drainage, sneezing.    HPI Patient is complaining of right sharp ear pain and tenderness around ear that started yesterday. Patient feels like there is drainage in ear canal, but does not see any external drainage. Also, complains of sinus drainage, right sore throat,   Denies fever, headache, chest pain, SHOB, cough, nausea, or vomiting.   She has took Tylenol  for symptoms with not much relief.  ROS See HPI above      Objective:   BP 116/72   Pulse 70   Temp 98.2 F (36.8 C) (Oral)   Ht 5\' 7"  (1.702 m)   Wt 229 lb (103.9 kg)   SpO2 98%   BMI 35.87 kg/m    Physical Exam Vitals reviewed.  Constitutional:      General: She is not in acute distress.    Appearance: Normal appearance. She is not ill-appearing, toxic-appearing or diaphoretic.  HENT:     Head: Normocephalic and atraumatic.     Right Ear: Ear canal and external ear normal. A middle ear effusion is present. Tympanic membrane is bulging. Tympanic membrane is not erythematous.     Left Ear: Tympanic membrane, ear canal and external ear normal. There is no impacted cerumen.     Mouth/Throat:     Pharynx: Oropharynx is clear. Uvula midline. No pharyngeal swelling, oropharyngeal exudate, posterior oropharyngeal erythema, uvula swelling or postnasal drip.  Eyes:     General:        Right eye: No discharge.        Left eye: No discharge.     Conjunctiva/sclera: Conjunctivae normal.  Cardiovascular:     Rate and Rhythm: Normal rate.  Pulmonary:     Effort: Pulmonary effort is normal. No respiratory distress.  Musculoskeletal:        General: Normal range of motion.  Lymphadenopathy:     Head:     Right side of head: Preauricular and posterior auricular adenopathy present.  Skin:    General: Skin is warm and dry.  Neurological:      General: No focal deficit present.     Mental Status: She is alert and oriented to person, place, and time. Mental status is at baseline.  Psychiatric:        Mood and Affect: Mood normal.        Behavior: Behavior normal.        Thought Content: Thought content normal.        Judgment: Judgment normal.       Assessment & Plan:  Non-recurrent acute serous otitis media of right ear -     Azithromycin ; Take 2 tablets on day 1, then 1 tablet daily on days 2 through 5  Dispense: 6 tablet; Refill: 0  -Prescribed Azithromycin  250mg  tablet, take 2 tablets today and 1 tablet 2nd through 5th day for right ear infection.  -Recommend to use over the counter Flonase to help clear eustachian tubes.  -Follow up if not improved.   Charisse Wendell, NP

## 2023-09-29 ENCOUNTER — Other Ambulatory Visit: Payer: Self-pay

## 2023-09-29 DIAGNOSIS — Z6839 Body mass index (BMI) 39.0-39.9, adult: Secondary | ICD-10-CM

## 2023-09-29 MED ORDER — ZEPBOUND 7.5 MG/0.5ML ~~LOC~~ SOLN: 7.5000 mg | SUBCUTANEOUS | 1 refills | Status: DC

## 2023-10-05 DIAGNOSIS — E119 Type 2 diabetes mellitus without complications: Secondary | ICD-10-CM | POA: Diagnosis not present

## 2023-10-06 ENCOUNTER — Other Ambulatory Visit: Payer: Self-pay

## 2023-10-06 ENCOUNTER — Telehealth: Payer: Self-pay | Admitting: "Endocrinology

## 2023-10-06 DIAGNOSIS — Z6839 Body mass index (BMI) 39.0-39.9, adult: Secondary | ICD-10-CM

## 2023-10-06 MED ORDER — ZEPBOUND 7.5 MG/0.5ML ~~LOC~~ SOLN
7.5000 mg | SUBCUTANEOUS | 1 refills | Status: DC
Start: 2023-10-06 — End: 2024-01-20

## 2023-10-06 NOTE — Telephone Encounter (Signed)
 Resent Rx refill for Zepbound  7.5mg  weekly to [facility]. Pt made aware.

## 2023-10-06 NOTE — Telephone Encounter (Signed)
 Pt states she has not heard anything from [facility] about her refill for zepbound .

## 2023-10-18 DIAGNOSIS — N390 Urinary tract infection, site not specified: Secondary | ICD-10-CM | POA: Diagnosis not present

## 2023-11-05 DIAGNOSIS — E119 Type 2 diabetes mellitus without complications: Secondary | ICD-10-CM | POA: Diagnosis not present

## 2023-11-23 DIAGNOSIS — H60391 Other infective otitis externa, right ear: Secondary | ICD-10-CM | POA: Diagnosis not present

## 2023-12-06 DIAGNOSIS — E119 Type 2 diabetes mellitus without complications: Secondary | ICD-10-CM | POA: Diagnosis not present

## 2023-12-22 DIAGNOSIS — R7303 Prediabetes: Secondary | ICD-10-CM | POA: Diagnosis not present

## 2023-12-22 DIAGNOSIS — E782 Mixed hyperlipidemia: Secondary | ICD-10-CM | POA: Diagnosis not present

## 2023-12-23 LAB — COMPREHENSIVE METABOLIC PANEL WITH GFR
ALT: 10 IU/L (ref 0–32)
AST: 13 IU/L (ref 0–40)
Albumin: 4.6 g/dL (ref 3.9–4.9)
Alkaline Phosphatase: 62 IU/L (ref 41–116)
BUN/Creatinine Ratio: 12 (ref 9–23)
BUN: 12 mg/dL (ref 6–24)
Bilirubin Total: 0.4 mg/dL (ref 0.0–1.2)
CO2: 22 mmol/L (ref 20–29)
Calcium: 9.6 mg/dL (ref 8.7–10.2)
Chloride: 103 mmol/L (ref 96–106)
Creatinine, Ser: 1.02 mg/dL — ABNORMAL HIGH (ref 0.57–1.00)
Globulin, Total: 2.7 g/dL (ref 1.5–4.5)
Glucose: 86 mg/dL (ref 70–99)
Potassium: 4.8 mmol/L (ref 3.5–5.2)
Sodium: 140 mmol/L (ref 134–144)
Total Protein: 7.3 g/dL (ref 6.0–8.5)
eGFR: 71 mL/min/1.73 (ref >59)

## 2023-12-23 LAB — MAGNESIUM: Magnesium: 2.1 mg/dL (ref 1.6–2.3)

## 2023-12-23 LAB — LIPID PANEL
Chol/HDL Ratio: 4.2 ratio (ref 0.0–4.4)
Cholesterol, Total: 180 mg/dL (ref 100–199)
HDL: 43 mg/dL (ref >39)
LDL Chol Calc (NIH): 116 mg/dL — ABNORMAL HIGH (ref 0–99)
Triglycerides: 114 mg/dL (ref 0–149)
VLDL Cholesterol Cal: 21 mg/dL (ref 5–40)

## 2023-12-23 LAB — INSULIN AND C-PEPTIDE, SERUM
C-Peptide: 4.1 ng/mL (ref 1.1–4.4)
INSULIN: 10.1 u[IU]/mL (ref 2.6–24.9)

## 2023-12-31 ENCOUNTER — Encounter: Payer: Self-pay | Admitting: "Endocrinology

## 2023-12-31 ENCOUNTER — Ambulatory Visit (INDEPENDENT_AMBULATORY_CARE_PROVIDER_SITE_OTHER): Admitting: "Endocrinology

## 2023-12-31 VITALS — BP 100/66 | HR 76 | Ht 67.0 in | Wt 210.2 lb

## 2023-12-31 DIAGNOSIS — Z6839 Body mass index (BMI) 39.0-39.9, adult: Secondary | ICD-10-CM | POA: Diagnosis not present

## 2023-12-31 DIAGNOSIS — E161 Other hypoglycemia: Secondary | ICD-10-CM | POA: Diagnosis not present

## 2023-12-31 DIAGNOSIS — E66811 Obesity, class 1: Secondary | ICD-10-CM

## 2023-12-31 DIAGNOSIS — Z6832 Body mass index (BMI) 32.0-32.9, adult: Secondary | ICD-10-CM

## 2023-12-31 DIAGNOSIS — R7303 Prediabetes: Secondary | ICD-10-CM | POA: Diagnosis not present

## 2023-12-31 DIAGNOSIS — E6609 Other obesity due to excess calories: Secondary | ICD-10-CM

## 2023-12-31 LAB — POCT GLYCOSYLATED HEMOGLOBIN (HGB A1C): HbA1c, POC (prediabetic range): 5 % — AB (ref 5.7–6.4)

## 2023-12-31 MED ORDER — ROSUVASTATIN CALCIUM 5 MG PO TABS: 5.0000 mg | ORAL_TABLET | Freq: Every evening | ORAL | 1 refills | Status: AC

## 2023-12-31 NOTE — Progress Notes (Signed)
 12/31/2023, 1:05 PM   Endocrinology follow-up note  Subjective:    Patient ID: Lisa Howe, female    DOB: [birth year].  Lisa Howe is being seen in follow-up after she was seen in consultation for management of prediabetes , hyperlipidemia, weight management requested by  Billy Philippe SAUNDERS, NP.   Past Medical History:  Diagnosis Date   Anxiety    panic attacks   Depression    PP anxiety and depression   Excessive daytime sleepiness    Gestational diabetes    HSV-1 (herpes simplex virus 1) infection    IBS (irritable bowel syndrome)    not sure what caused it   Migraines    Obesity (BMI 30-39.9) 01/11/2019   OCD (obsessive compulsive disorder)    PCOS (polycystic ovarian syndrome)    Vaginal Pap smear, abnormal     Past Surgical History:  Procedure Laterality Date   TONSILLECTOMY     wisdom teeth       Social History   Socioeconomic History   Marital status: Married    Spouse name: Not on file   Number of children: 2   Years of education: Not on file   Highest education level: Associate degree: academic program  Occupational History   Not on file  Tobacco Use   Smoking status: Former    Current packs/day: 0.00    Types: Cigarettes    Start date: 01/2008    Quit date: 01/2013    Years since quitting: 10.9   Smokeless tobacco: Never   Tobacco comments:    3-5 cigarettes a day  Vaping Use   Vaping status: Never Used  Substance and Sexual Activity   Alcohol use: Yes    Comment: Once every 3 months.   Drug use: No   Sexual activity: Yes    Partners: Male    Birth control/protection: Condom, I.U.D.  Other Topics Concern   Not on file  Social History Narrative   Married 6 years,second. First marriage lasted 7 years. 2 dogs, 1 bunny.   Social Drivers of Corporate investment banker Strain: Not on file  Food Insecurity: Not on file  Transportation Needs: Not on file   Physical Activity: Not on file  Stress: Not on file  Social Connections: Unknown (08/16/2021)   Received from [facility]   Social Network    Social Network: Not on file    Family History  Problem Relation Age of Onset   Diabetes Mother    Hypertension Mother    Hyperlipidemia Mother    Thyroid  disease Mother    Pulmonary fibrosis Father    Hypertension Father    Hyperlipidemia Father    Hyperlipidemia Sister    Cancer Maternal Grandmother        melanoma   Parkinson's disease Maternal Grandfather    Heart disease Maternal Grandfather    Diabetes Maternal Grandfather    Cancer Paternal Grandmother        liver   Diabetes Paternal Grandmother    Heart disease Paternal Grandfather    COPD Paternal Grandfather     Outpatient Encounter Medications as of 12/31/2023  Medication Sig   rosuvastatin  (CRESTOR ) 5 MG tablet Take 1 tablet (5 mg total) by mouth at bedtime.   ALPRAZolam (XANAX XR) 1 MG 24 hr tablet Take 1 mg by mouth daily. prn   azelastine (ASTELIN) 0.1 % nasal spray Place 2 sprays into both nostrils 2 (two) times daily.   busPIRone (BUSPAR) 10 MG tablet Take 10 mg by mouth 2 (two) times daily.   Cetirizine HCl (ZYRTEC ALLERGY) 10 MG CAPS ZyrTEC   fluticasone (FLONASE) 50 MCG/ACT nasal spray SHAKE LIQUID AND USE 1 SPRAY IN EACH NOSTRIL TWICE DAILY AS NEEDED   Insulin  Pen Needle 29G X MISC Use to inject Mounjaro  weekly   Magnesium 100 MG CAPS Magnesium   metFORMIN  (GLUCOPHAGE -XR) 500 MG 24 hr tablet Take 1 tablet (500 mg total) by mouth daily with breakfast.   Multiple Vitamin (MULTIVITAMIN ADULT PO) Take 1 tablet by mouth daily.   omeprazole (PRILOSEC) 10 MG capsule Take 10 mg by mouth daily.   ondansetron  (ZOFRAN -ODT) 4 MG disintegrating tablet Take 1 tablet (4 mg total) by mouth every 8 (eight) hours as needed for nausea or vomiting.   Probiotic Product (FORTIFY PROBIOTIC WOMENS EX ST PO) Take by mouth daily.   sertraline  (ZOLOFT ) 100 MG tablet Take 200 mg by  mouth daily.   spironolactone  (ALDACTONE ) 50 MG tablet TAKE 1 TABLET(50 MG) BY MOUTH DAILY   tirzepatide  (ZEPBOUND ) 7.5 MG/0.5ML injection vial Inject 7.5 mg into the skin once a week.   [DISCONTINUED] rizatriptan  (MAXALT ) 10 MG tablet Take 1 tablet (10 mg total) by mouth daily as needed.   No facility-administered encounter medications on file as of 12/31/2023.    ALLERGIES: Allergies  Allergen Reactions   Advil [Ibuprofen] Anaphylaxis    Coated / dye    Cefazolin Other (See Comments)   Penicillin G Other (See Comments)   Vancomycin  Hives    Hives on chest and back. Tingling around mouth.    Amoxicillin Rash   Penicillins Rash and Other (See Comments)    Has patient had a PCN reaction causing immediate rash, facial/tongue/throat swelling, SOB or lightheadedness with hypotension: Yes  Has patient had a PCN reaction causing severe rash involving mucus membranes or skin necrosis: No  Has patient had a PCN reaction that required hospitalization No  Has patient had a PCN reaction occurring within the last 10 years: No  If all of the above answers are NO, then may proceed with Cephalosporin use.    VACCINATION STATUS: Immunization History  Administered Date(s) Administered   Influenza,inj,Quad PF,6+ Mos 01/01/2021   Influenza-Unspecified 02/02/2020   Moderna Sars-Covid-2 Vaccination 06/24/2019, 07/26/2019, 02/02/2020   Td 04/06/2016   Tdap 04/06/2012    Diabetes She presents for her follow-up diabetic visit. Diabetes type: Prediabetes. The initial diagnosis of diabetes was made 1 year ago. Her disease course has been improving. Pertinent negatives for hypoglycemia include no confusion, headaches, pallor or seizures. Pertinent negatives for diabetes include no chest pain, no fatigue, no polydipsia, no polyphagia and no polyuria. There are no hypoglycemic complications. Symptoms are improving. (She has medical history of PCOS, hyperlipidemia, obesity, sedentary lifestyle.) Risk  factors for coronary artery disease include dyslipidemia, family history, sedentary lifestyle and obesity (Prediabetes, metabolic syndrome, PCOS, gestational diabetes x 2.). Current diabetic treatments: Metformin  500 mg p.o. daily, Zepbound  7.5 mg subcutaneously weekly. Her weight is decreasing steadily (Patient presents with 47 pounds of weight loss overall.). She has not had a previous visit with a dietitian. She rarely participates in exercise. Her  home blood glucose trend is decreasing steadily. (She does not monitor blood glucose regularly.  Her point-of-care A1c is improved to 5% from 6.4%.  ) An ACE inhibitor/angiotensin II receptor blocker is not being taken.  Hyperlipidemia This is a chronic problem. Recent lipid tests were reviewed and are high. She has no history of obesity. Pertinent negatives include no chest pain, myalgias or shortness of breath. She is currently on no antihyperlipidemic treatment. Risk factors for coronary artery disease include dyslipidemia, family history and obesity (Prediabetes, metabolic syndrome).     Objective:       12/31/2023    8:31 AM 09/07/2023    2:23 PM 06/24/2023    8:31 AM  Vitals with BMI  Height 5' 7 5' 7 5' 7  Weight 210 lbs 3 oz 229 lbs 238 lbs 13 oz  BMI 32.91 35.86 37.39  Systolic 100 116 96  Diastolic 66 72 74  Pulse 76 70 88    BP 100/66   Pulse 76   Ht 5' 7 (1.702 m)   Wt 210 lb 3.2 oz (95.3 kg)   BMI 32.92 kg/m   Wt Readings from Last 3 Encounters:  12/31/23 210 lb 3.2 oz (95.3 kg)  09/07/23 229 lb (103.9 kg)  06/24/23 238 lb 12.8 oz (108.3 kg)     Diabetic Labs (most recent): Lab Results  Component Value Date   HGBA1C 5.0 (A) 12/31/2023   HGBA1C 5.4 (A) 06/24/2023   HGBA1C 6.4 02/25/2023     Lipid Panel ( most recent) Lipid Panel     Component Value Date/Time   CHOL 180 12/22/2023 0945   TRIG 114 12/22/2023 0945   HDL 43 12/22/2023 0945   CHOLHDL 4.2 12/22/2023 0945   CHOLHDL 4 06/17/2022 0844   VLDL 30.0  06/17/2022 0844   LDLCALC 116 (H) 12/22/2023 0945   LDLCALC 82 08/27/2020 1559   LABVLDL 21 12/22/2023 0945     Lab Results  Component Value Date   TSH 2.520 02/16/2023   TSH 2.69 03/18/2021   TSH 0.04 (L) 08/27/2020   TSH 0.18 (L) 07/12/2019   TSH 0.94 01/11/2019   TSH 2.153 12/28/2013   FREET4 1.00 02/16/2023   FREET4 1.0 08/27/2020   FREET4 0.9 07/12/2019    Recent Results (from the past 2160 hours)  Comprehensive metabolic panel     Status: Abnormal   Collection Time: 12/22/23  9:45 AM  Result Value Ref Range   Glucose 86 70 - 99 mg/dL   BUN 12 6 - 24 mg/dL   Creatinine, Ser 8.97 (H) 0.57 - 1.00 mg/dL   eGFR 71 >40 fO/fpw/8.26   BUN/Creatinine Ratio 12 9 - 23   Sodium 140 134 - 144 mmol/L   Potassium 4.8 3.5 - 5.2 mmol/L   Chloride 103 96 - 106 mmol/L   CO2 22 20 - 29 mmol/L   Calcium  9.6 8.7 - 10.2 mg/dL   Total Protein 7.3 6.0 - 8.5 g/dL   Albumin 4.6 3.9 - 4.9 g/dL   Globulin, Total 2.7 1.5 - 4.5 g/dL   Bilirubin Total 0.4 0.0 - 1.2 mg/dL   Alkaline Phosphatase 62 41 - 116 IU/L    Comment:               **Please note reference interval change**   AST 13 0 - 40 IU/L   ALT 10 0 - 32 IU/L  Lipid panel     Status: Abnormal   Collection Time: 12/22/23  9:45 AM  Result  Value Ref Range   Cholesterol, Total 180 100 - 199 mg/dL   Triglycerides 885 0 - 149 mg/dL   HDL 43 >60 mg/dL   VLDL Cholesterol Cal 21 5 - 40 mg/dL   LDL Chol Calc (NIH) 883 (H) 0 - 99 mg/dL   Chol/HDL Ratio 4.2 0.0 - 4.4 ratio    Comment:                                   T. Chol/HDL Ratio                                             Men  Women                               1/2 Avg.Risk  3.4    3.3                                   Avg.Risk  5.0    4.4                                2X Avg.Risk  9.6    7.1                                3X Avg.Risk 23.4   11.0   Insulin  and C-Peptide     Status: None   Collection Time: 12/22/23  9:45 AM  Result Value Ref Range   INSULIN  10.1 2.6 - 24.9  uIU/mL   C-Peptide 4.1 1.1 - 4.4 ng/mL    Comment: C-Peptide reference interval is for fasting patients.  Magnesium     Status: None   Collection Time: 12/22/23  9:45 AM  Result Value Ref Range   Magnesium 2.1 1.6 - 2.3 mg/dL  HgB J8r     Status: Abnormal   Collection Time: 12/31/23  8:43 AM  Result Value Ref Range   Hemoglobin A1C     HbA1c POC (<> result, manual entry)     HbA1c, POC (prediabetic range) 5.0 (A) 5.7 - 6.4 %   HbA1c, POC (controlled diabetic range)       Assessment & Plan:   1. Prediabetes/hyperinsulinemia-improving 2. Mixed hyperlipidemia 3. BMI 39.0-39.9,adult 4. Hypervitaminosis D   - Lisa Howe has currently controlled asymptomatic prediabetes with point-of-care A1c of 5% improving from 6.4%.  She is responding to Zepbound  currently 7.5 mg subcutaneously weekly.  Her previous labs were discussed with her which includes hyperinsulinemia indicative of insulin  resistance.  Her total insulin  and C-peptide continued to decrease progressively towards target.  She remains on metformin  500 mg XR p.o. daily at breakfast and advised to continue on Zepbound  7.5 mg subcutaneously weekly.    She does not report any side effects from this medication.  She presents with approximately 20 % of body weight loss which has had significant impact on her cardiometabolic health. Patient has prior history of gestational diabetes x 2, and medical history of PCOS.    -She has additional  risk factors including high BMI which is  coming down, hyperlipidemia, semi-sedentary lifestyle and she remains at a high risk for more acute and chronic complications which include CAD, CVA, CKD, retinopathy, and neuropathy. These are all discussed in detail with her.  Metabolic syndrome with multiple manifestations  makes her a Perfect candidate for lifestyle medicine.  -She would also continue to benefit from pharmaceutical intervention as discussed above.   - I discussed all available options of  managing her pre-diabetes including de-escalation of medications. I have counseled her on Food as Medicine by adopting a Whole Food , Plant Predominant  ( WFPP) nutrition as recommended by [employer] of Lifestyle Medicine. Patient is encouraged to switch to  unprocessed or minimally processed  complex starch, adequate protein intake (mainly plant source), minimal liquid fat, plenty of fruits, and vegetables. -  she is advised to stick to a routine mealtimes to eat 3 complete meals a day and snack only when necessary ( to snack only to correct hypoglycemia BG <70 day time or <100 at night).   - she acknowledges that there is a room for improvement in her food and drink choices. - Suggestion is made for her to avoid simple carbohydrates  from her diet including Cakes, Sweet Desserts, Ice Cream, Soda (diet and regular), Sweet Tea, Candies, Chips, Cookies, Store Bought Juices, Alcohol , Artificial Sweeteners,  Coffee Creamer, and Sugar-free Products, Lemonade. This will help patient to have more stable blood glucose profile and potentially avoid unintended weight gain.  The following Lifestyle Medicine recommendations according to American College of Lifestyle Medicine  [facility]) were discussed and and offered to patient and she  agrees to start the journey:  A. Whole Foods, Plant-Based Nutrition comprising of fruits and vegetables, plant-based proteins, whole-grain carbohydrates was discussed in detail with the patient.   A list for source of those nutrients were also provided to the patient.  Patient will use only water or unsweetened tea for hydration. B.  The need to stay away from risky substances including alcohol, smoking; obtaining 7 to 9 hours of restorative sleep, at least 150 minutes of moderate intensity exercise weekly, the importance of healthy social connections,  and stress management techniques were discussed. C.  A full color page of  Calorie density of various food groups per pound  showing examples of each food groups was provided to the patient.       -She is advised to continue metformin  500 mg XR p.o. daily at breakfast.   -She has euthyroid state, will not need any thyroid  hormone replacement. -Vitamin D  has dropped to appropriate levels at 44.  She may need maintenance dose vitamin D  with 2000 units daily. She is also on multivitamin supplements.  - Specific targets for  A1c;  LDL, HDL,  and Triglycerides were discussed with the patient.  2) Blood Pressure /Hypertension:  Her blood pressure is controlled today.  She remains on spironolactone  50 mg p.o. daily  which appears to have been initiated because of her history of PCOS.     3) Lipids/Hyperlipidemia:   Review of her recent lipid panel showed uncontrolled  LDL at 116 .She will benefit from early initiation of low-dose statins.   I discussed and initiated Crestor  5 mg p.o. nightly.  Whole food plant-based diet discussed above will help with dyslipidemia.     4)  Weight/Diet:  Body mass index is 32.92 kg/m.  -Presents with 20% of body weight loss which will help significantly as discussed above.  She is still a candidate for some  more weight loss.  5) Chronic Care/Health Maintenance:  -she  is not  on ACEI/ARB and Statin medications and  is encouraged to initiate and continue to follow up with Ophthalmology, Dentist,  Podiatrist at least yearly or according to recommendations, and advised to  stay away from smoking. I have recommended yearly flu vaccine and pneumonia vaccine at least every 5 years; moderate intensity exercise for up to 150 minutes weekly; and  sleep for 7- 9 hours a day.  - she is  advised to maintain close follow up with Billy Philippe SAUNDERS, NP for primary care needs, as well as her other providers for optimal and coordinated care.   I spent  25  minutes in the care of the patient today including review of labs from Thyroid  Function, CMP, and other relevant labs ; imaging/biopsy records  (current and previous including abstractions from other facilities); face-to-face time discussing  her lab results and symptoms, medications doses, her options of short and long term treatment based on the latest standards of care / guidelines;   and documenting the encounter.  Lisa Howe  participated in the discussions, expressed understanding, and voiced agreement with the above plans.  All questions were answered to her satisfaction. she is encouraged to contact clinic should she have any questions or concerns prior to her return visit.   Follow up plan: - Return in about 6 months (around 06/29/2024) for Fasting Labs  in AM B4 8, A1c -[place].  Ranny Earl, MD [facility] [place] [address] [place], [place] [address] Phone: [phone]  Fax: [phone]    12/31/2023, 1:05 PM  This note was partially dictated with voice recognition software. Similar sounding words can be transcribed inadequately or may not  be corrected upon review.

## 2024-01-05 DIAGNOSIS — E119 Type 2 diabetes mellitus without complications: Secondary | ICD-10-CM | POA: Diagnosis not present

## 2024-01-11 DIAGNOSIS — J0100 Acute maxillary sinusitis, unspecified: Secondary | ICD-10-CM | POA: Diagnosis not present

## 2024-01-20 ENCOUNTER — Other Ambulatory Visit: Payer: Self-pay | Admitting: "Endocrinology

## 2024-01-20 DIAGNOSIS — Z6839 Body mass index (BMI) 39.0-39.9, adult: Secondary | ICD-10-CM

## 2024-01-31 ENCOUNTER — Other Ambulatory Visit: Payer: Self-pay

## 2024-01-31 DIAGNOSIS — R7303 Prediabetes: Secondary | ICD-10-CM

## 2024-01-31 MED ORDER — METFORMIN HCL ER 500 MG PO TB24: 500.0000 mg | ORAL_TABLET | Freq: Every day | ORAL | 1 refills | Status: AC

## 2024-02-05 DIAGNOSIS — E119 Type 2 diabetes mellitus without complications: Secondary | ICD-10-CM | POA: Diagnosis not present

## 2024-02-14 DIAGNOSIS — M545 Low back pain, unspecified: Secondary | ICD-10-CM | POA: Diagnosis not present

## 2024-03-08 ENCOUNTER — Encounter: Payer: Self-pay | Admitting: "Endocrinology

## 2024-03-09 ENCOUNTER — Other Ambulatory Visit: Payer: Self-pay | Admitting: "Endocrinology

## 2024-03-09 MED ORDER — TIRZEPATIDE-WEIGHT MANAGEMENT 5 MG/0.5ML ~~LOC~~ SOLN: 5.0000 mg | SUBCUTANEOUS | 0 refills | Status: AC

## 2024-03-15 DIAGNOSIS — F411 Generalized anxiety disorder: Secondary | ICD-10-CM | POA: Diagnosis not present

## 2024-03-23 DIAGNOSIS — Z1231 Encounter for screening mammogram for malignant neoplasm of breast: Secondary | ICD-10-CM | POA: Diagnosis not present

## 2024-03-23 DIAGNOSIS — Z1151 Encounter for screening for human papillomavirus (HPV): Secondary | ICD-10-CM | POA: Diagnosis not present

## 2024-03-23 DIAGNOSIS — Z6834 Body mass index (BMI) 34.0-34.9, adult: Secondary | ICD-10-CM | POA: Diagnosis not present

## 2024-03-23 DIAGNOSIS — Z124 Encounter for screening for malignant neoplasm of cervix: Secondary | ICD-10-CM | POA: Diagnosis not present

## 2024-03-23 DIAGNOSIS — Z01419 Encounter for gynecological examination (general) (routine) without abnormal findings: Secondary | ICD-10-CM | POA: Diagnosis not present

## 2024-06-30 ENCOUNTER — Ambulatory Visit: Admitting: "Endocrinology
